# Patient Record
Sex: Female | Born: 1965 | Race: White | Hispanic: No | Marital: Single | Smoking: Former smoker
Health system: Southern US, Community
[De-identification: ages and names within clinical notes are randomized; demographics above are authoritative.]

## PROBLEM LIST (undated history)

## (undated) DIAGNOSIS — Z8619 Personal history of other infectious and parasitic diseases: Secondary | ICD-10-CM

## (undated) DIAGNOSIS — R87810 Cervical high risk human papillomavirus (HPV) DNA test positive: Secondary | ICD-10-CM

## (undated) DIAGNOSIS — K589 Irritable bowel syndrome without diarrhea: Secondary | ICD-10-CM

## (undated) DIAGNOSIS — Z9189 Other specified personal risk factors, not elsewhere classified: Secondary | ICD-10-CM

## (undated) DIAGNOSIS — Z803 Family history of malignant neoplasm of breast: Secondary | ICD-10-CM

## (undated) DIAGNOSIS — E559 Vitamin D deficiency, unspecified: Secondary | ICD-10-CM

## (undated) DIAGNOSIS — Z1371 Encounter for nonprocreative screening for genetic disease carrier status: Secondary | ICD-10-CM

## (undated) HISTORY — DX: Vitamin D deficiency, unspecified: E55.9

## (undated) HISTORY — PX: OTHER SURGICAL HISTORY: SHX169

## (undated) HISTORY — DX: Family history of malignant neoplasm of breast: Z80.3

## (undated) HISTORY — DX: Cervical high risk human papillomavirus (HPV) DNA test positive: R87.810

## (undated) HISTORY — DX: Encounter for nonprocreative screening for genetic disease carrier status: Z13.71

## (undated) HISTORY — DX: Irritable bowel syndrome, unspecified: K58.9

## (undated) HISTORY — DX: Personal history of other infectious and parasitic diseases: Z86.19

## (undated) HISTORY — DX: Other specified personal risk factors, not elsewhere classified: Z91.89

---

## 1998-05-14 ENCOUNTER — Other Ambulatory Visit: Admission: RE | Admit: 1998-05-14 | Discharge: 1998-05-14 | Payer: Self-pay | Admitting: Obstetrics and Gynecology

## 1999-12-26 ENCOUNTER — Other Ambulatory Visit: Admission: RE | Admit: 1999-12-26 | Discharge: 1999-12-26 | Payer: Self-pay | Admitting: Obstetrics and Gynecology

## 2011-04-18 ENCOUNTER — Ambulatory Visit: Payer: Self-pay | Admitting: Internal Medicine

## 2014-02-06 HISTORY — PX: COLONOSCOPY: SHX174

## 2014-05-11 ENCOUNTER — Ambulatory Visit
Admit: 2014-05-11 | Disposition: A | Discharge status: Home / Self Care | Payer: Self-pay | Attending: Gastroenterology | Admitting: Gastroenterology

## 2014-09-18 ENCOUNTER — Ambulatory Visit: Payer: Self-pay | Admitting: Family Medicine

## 2014-09-21 ENCOUNTER — Ambulatory Visit (INDEPENDENT_AMBULATORY_CARE_PROVIDER_SITE_OTHER): Payer: BLUE CROSS/BLUE SHIELD | Admitting: Family Medicine

## 2014-09-21 ENCOUNTER — Encounter: Payer: Self-pay | Admitting: Family Medicine

## 2014-09-21 VITALS — BP 110/70 | HR 68 | Ht 65.0 in | Wt 175.0 lb

## 2014-09-21 DIAGNOSIS — S46012S Strain of muscle(s) and tendon(s) of the rotator cuff of left shoulder, sequela: Secondary | ICD-10-CM

## 2014-09-21 DIAGNOSIS — K589 Irritable bowel syndrome without diarrhea: Secondary | ICD-10-CM

## 2014-09-21 DIAGNOSIS — IMO0002 Reserved for concepts with insufficient information to code with codable children: Secondary | ICD-10-CM | POA: Insufficient documentation

## 2014-09-21 DIAGNOSIS — M7502 Adhesive capsulitis of left shoulder: Secondary | ICD-10-CM

## 2014-09-21 NOTE — Progress Notes (Signed)
Name: Kelsey Hoover   MRN: [id number]    DOB: 11-12-1965   Date:09/21/2014       Progress Note  Subjective  Chief Complaint  Chief Complaint  Patient presents with  . Shoulder Pain    hurts to lift shoulder above head or put bra on- tried PT and chiropractor    Shoulder Pain  The pain is present in the left arm and left shoulder. This is a chronic problem. The current episode started more than 1 year ago. There has been a history of trauma (fellon outstreched arm). The problem occurs daily. The problem has been waxing and waning. The quality of the pain is described as aching. The pain is at a severity of 5/10. The pain is moderate. Associated symptoms include a limited range of motion, stiffness and tingling. Pertinent negatives include no fever, joint locking, joint swelling or numbness. Associated symptoms comments: Left ulna distribution. The symptoms are aggravated by activity. The treatment provided no relief.    No problem-specific assessment & plan notes found for this encounter.   History reviewed. No pertinent past medical history.  Past Surgical History  Procedure Laterality Date  . Colonoscopy  2016    cleared for 5 yrs- Dr Servando Snare    Family History  Problem Relation Age of Onset  . Cancer Mother     Social History   Social History  . Marital Status: Married    Spouse Name: N/A  . Number of Children: N/A  . Years of Education: N/A   Occupational History  . Not on file.   Social History Main Topics  . Smoking status: Former Games developer  . Smokeless tobacco: Not on file  . Alcohol Use: No  . Drug Use: No  . Sexual Activity: Yes   Other Topics Concern  . Not on file   Social History Narrative  . No narrative on file    Allergies  Allergen Reactions  . Codeine      Review of Systems  Constitutional: Negative for fever, chills, weight loss and malaise/fatigue.  HENT: Negative for ear discharge, ear pain and sore throat.   Eyes: Negative for blurred  vision.  Respiratory: Negative for cough, sputum production, shortness of breath and wheezing.   Cardiovascular: Negative for chest pain, palpitations and leg swelling.  Gastrointestinal: Negative for heartburn, nausea, abdominal pain, diarrhea, constipation, blood in stool and melena.  Genitourinary: Negative for dysuria, urgency, frequency and hematuria.  Musculoskeletal: Positive for stiffness. Negative for myalgias, back pain, joint pain and neck pain.  Skin: Negative for rash.  Neurological: Positive for tingling. Negative for dizziness, sensory change, focal weakness, numbness and headaches.  Endo/Heme/Allergies: Negative for environmental allergies and polydipsia. Does not bruise/bleed easily.  Psychiatric/Behavioral: Negative for depression and suicidal ideas. The patient is not nervous/anxious and does not have insomnia.      Objective  Filed Vitals:   09/21/14 1436  BP: 110/70  Pulse: 68  Height:  (1.651 m)  Weight: 175 lb (79.379 kg)    Physical Exam    Assessment & Plan  Problem List Items Addressed This Visit    None        Dr. Elizabeth Sauer [facility]  09/21/2014

## 2014-09-21 NOTE — Addendum Note (Signed)
Addended by: Duanne Limerick on: 09/21/2014 03:34 PM   Modules accepted: Kipp Brood

## 2014-09-21 NOTE — Progress Notes (Signed)
Name: Kelsey Hoover   MRN: [id number]    DOB: Aug 16, 1965   Date:09/21/2014       Progress Note  Subjective  Chief Complaint  Chief Complaint  Patient presents with  . Shoulder Pain    hurts to lift shoulder above head or put bra on- tried PT and chiropractor    Shoulder Pain     No problem-specific assessment & plan notes found for this encounter.   History reviewed. No pertinent past medical history.  Past Surgical History  Procedure Laterality Date  . Colonoscopy  2016    cleared for 5 yrs- Dr Servando Snare    Family History  Problem Relation Age of Onset  . Cancer Mother     Social History   Social History  . Marital Status: Married    Spouse Name: N/A  . Number of Children: N/A  . Years of Education: N/A   Occupational History  . Not on file.   Social History Main Topics  . Smoking status: Former Games developer  . Smokeless tobacco: Not on file  . Alcohol Use: No  . Drug Use: No  . Sexual Activity: Yes   Other Topics Concern  . Not on file   Social History Narrative  . No narrative on file    Allergies  Allergen Reactions  . Codeine      ROS   Objective  Filed Vitals:   09/21/14 1436  BP: 110/70  Pulse: 68  Height:  (1.651 m)  Weight: 175 lb (79.379 kg)    Physical Exam  Constitutional: She is well-developed, well-nourished, and in no distress. No distress.  HENT:  Head: Normocephalic and atraumatic.  Right Ear: External ear normal.  Left Ear: External ear normal.  Nose: Nose normal.  Mouth/Throat: Oropharynx is clear and moist.  Eyes: Conjunctivae and EOM are normal. Pupils are equal, round, and reactive to light. Right eye exhibits no discharge. Left eye exhibits no discharge.  Neck: Normal range of motion. Neck supple. No JVD present. No thyromegaly present.  Cardiovascular: Normal rate, regular rhythm, normal heart sounds and intact distal pulses.  Exam reveals no gallop and no friction rub.   No murmur heard. Pulmonary/Chest: Effort  normal and breath sounds normal.  Abdominal: Soft. Bowel sounds are normal. She exhibits no mass. There is no tenderness. There is no guarding.  Musculoskeletal: Normal range of motion. She exhibits no edema.       Left shoulder: She exhibits tenderness, pain and decreased strength.       Arms: Drop test pos/ decreased ROM esp abduction  Lymphadenopathy:    She has no cervical adenopathy.  Neurological: She is alert. She has normal reflexes.  Skin: Skin is warm and dry. She is not diaphoretic.  Psychiatric: Mood and affect normal.      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 Elizabeth Sauer [facility]  09/21/2014

## 2014-10-02 ENCOUNTER — Telehealth: Payer: Self-pay | Admitting: Gastroenterology

## 2014-10-02 NOTE — Telephone Encounter (Signed)
Patient takes Levsin prescribed by Tommy Rainwater and was recently  prescribed Voltaran by another physician. She would like to know if there are any interactions between the two. I suggested she call her pharmacist, but she would like to speak with the nurse about it. Please call and advise.

## 2014-10-02 NOTE — Telephone Encounter (Signed)
Returned phone call to patient at this time. No answer. Left voicemail for return phone call.  If phone call is returned, Neither of these medications interact with one another or any of the other medications on patient's current list.

## 2014-10-05 NOTE — Telephone Encounter (Signed)
Call made to patient once again at this time. Explained the information below. She verbalizes understanding of all information.  Encouraged to call once again with any further questions.

## 2015-02-07 DIAGNOSIS — R87810 Cervical high risk human papillomavirus (HPV) DNA test positive: Secondary | ICD-10-CM

## 2015-02-07 HISTORY — DX: Cervical high risk human papillomavirus (HPV) DNA test positive: R87.810

## 2016-01-04 ENCOUNTER — Other Ambulatory Visit: Payer: Self-pay

## 2016-08-11 ENCOUNTER — Telehealth: Payer: Self-pay

## 2016-08-11 NOTE — Telephone Encounter (Signed)
Pt has questions re herpes tx.  She has a new man who has herpes but she hasn't slept with him yet. One question she has is what is the risk of getting it if there is no outbreak.  Adv I did not have that statistic.  I offered to mail her the pamphlet but she declined.  Pt states ABC and she has discussed extensively and wants ABC to call her Mon.  [phone]

## 2016-08-14 ENCOUNTER — Encounter: Payer: Self-pay | Admitting: Obstetrics and Gynecology

## 2016-08-14 NOTE — Telephone Encounter (Signed)
Pt is calling due to a missed call please call patient.

## 2016-08-14 NOTE — Telephone Encounter (Signed)
LMTRC

## 2016-08-14 NOTE — Telephone Encounter (Signed)
Herpes etiology discussed. Suggested valtrex daily as preventive for partner. Questions answered.

## 2016-11-06 DIAGNOSIS — Z1371 Encounter for nonprocreative screening for genetic disease carrier status: Secondary | ICD-10-CM

## 2016-11-06 DIAGNOSIS — Z9189 Other specified personal risk factors, not elsewhere classified: Secondary | ICD-10-CM

## 2016-11-06 HISTORY — DX: Encounter for nonprocreative screening for genetic disease carrier status: Z13.71

## 2016-11-06 HISTORY — DX: Other specified personal risk factors, not elsewhere classified: Z91.89

## 2016-11-28 ENCOUNTER — Encounter: Payer: Self-pay | Admitting: Obstetrics and Gynecology

## 2016-11-28 ENCOUNTER — Ambulatory Visit (INDEPENDENT_AMBULATORY_CARE_PROVIDER_SITE_OTHER): Payer: BLUE CROSS/BLUE SHIELD | Admitting: Obstetrics and Gynecology

## 2016-11-28 ENCOUNTER — Telehealth: Payer: Self-pay | Admitting: Obstetrics and Gynecology

## 2016-11-28 VITALS — BP 110/60 | HR 58 | Ht 65.0 in | Wt 156.0 lb

## 2016-11-28 DIAGNOSIS — Z803 Family history of malignant neoplasm of breast: Secondary | ICD-10-CM | POA: Diagnosis not present

## 2016-11-28 DIAGNOSIS — Z124 Encounter for screening for malignant neoplasm of cervix: Secondary | ICD-10-CM

## 2016-11-28 DIAGNOSIS — N951 Menopausal and female climacteric states: Secondary | ICD-10-CM | POA: Diagnosis not present

## 2016-11-28 DIAGNOSIS — Z1151 Encounter for screening for human papillomavirus (HPV): Secondary | ICD-10-CM | POA: Diagnosis not present

## 2016-11-28 DIAGNOSIS — Z1329 Encounter for screening for other suspected endocrine disorder: Secondary | ICD-10-CM | POA: Diagnosis not present

## 2016-11-28 DIAGNOSIS — Z1321 Encounter for screening for nutritional disorder: Secondary | ICD-10-CM

## 2016-11-28 DIAGNOSIS — Z0000 Encounter for general adult medical examination without abnormal findings: Secondary | ICD-10-CM | POA: Diagnosis not present

## 2016-11-28 DIAGNOSIS — Z01419 Encounter for gynecological examination (general) (routine) without abnormal findings: Secondary | ICD-10-CM

## 2016-11-28 DIAGNOSIS — Z13220 Encounter for screening for lipoid disorders: Secondary | ICD-10-CM | POA: Diagnosis not present

## 2016-11-28 DIAGNOSIS — Z1239 Encounter for other screening for malignant neoplasm of breast: Secondary | ICD-10-CM

## 2016-11-28 DIAGNOSIS — Z1231 Encounter for screening mammogram for malignant neoplasm of breast: Secondary | ICD-10-CM

## 2016-11-28 DIAGNOSIS — Z79890 Hormone replacement therapy (postmenopausal): Secondary | ICD-10-CM

## 2016-11-28 MED ORDER — PROGESTERONE MICRONIZED 100 MG PO CAPS: 100.0000 mg | ORAL_CAPSULE | Freq: Every day | ORAL | 12 refills | Status: DC

## 2016-11-28 NOTE — Telephone Encounter (Signed)
-----   Message from Rica RecordsAlicia B Copland, PA-C sent at 11/28/2016 10:12 AM EDT ----- Regarding: appt Pls call pt to sched 6 wk MyRisk results f/u appt. Forgot to put on form before check out. Sry. Thx!!

## 2016-11-28 NOTE — Telephone Encounter (Signed)
Called and lvm for pt to call back to be schedule °

## 2016-11-28 NOTE — Progress Notes (Signed)
PCP: Patient, No Pcp Per   Chief Complaint  Patient presents with  . Gynecologic Exam    HPI:      [name] is a 51 y.o. [phone] who LMP was No LMP recorded., presents today for her annual examination.  Her menses are absent now due to menopause. She does not have intermenstrual bleeding.  She does not have vasomotor sx. She uses prometrium 100 mg 6 nights on, 1 night off. She does get sx if she drinks alcohol or eats certain foods.  Sex activity: not sexually active. She does not have vaginal dryness.  Last Pap: November 15, 2015  Results were: no abnormalities /POS HPV DNA. Repeat due today. Hx of STDs: HPV  Last mammogram: November 29, 2015  Results were: normal--routine follow-up in 12 months There is a FH of breast cancer in her mom 3 times (2 times in 1 breast, 1 time in contralateral breast). Her mat aunt had colon cancer and her MGF had pancreatic cancer. There is no FH of ovarian cancer. The patient does not do self-breast exams. Genetic testing attempted in the past but denied by [facility], even though meets guidelines. She is willing to try again because she clearly meets NCCN guidelines.  Colonoscopy: colonoscopy 3 years ago without abnormalities. . Repeat due after 5-10 years (pt unsure).   Tobacco use: The patient denies current or previous tobacco use. Alcohol use: social drinker Exercise: very active  She does get adequate calcium and Vitamin D in her diet.  She has not had any recent labs.   Past Medical History:  Diagnosis Date  . 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 Family history of breast cancer   . History of cold sores   . Vitamin D deficiency     Past Surgical History:  Procedure Laterality Date  . COLONOSCOPY  2016   cleared for 5 yrs- Dr Servando Snare  . cyrotherapy     age 54    Family History  Problem Relation Age of Onset  . Breast cancer Mother 26       age 64 and 12, bilat breasts at age 57, rt breast 70   . Pancreatic cancer Paternal Grandfather 43  . Colon cancer Maternal Aunt 49    Social History   Social History  . Marital status: Single    Spouse name: N/A  . Number of children: N/A  . Years of education: N/A   Occupational History  . Not on file.   Social History Main Topics  . Smoking status: Former Games developer  . Smokeless tobacco: Never Used  . Alcohol use No  . Drug use: No  . Sexual activity: Yes   Other Topics Concern  . Not on file   Social History Narrative  . No narrative on file    Current Meds  Medication Sig  . progesterone (PROMETRIUM) 100 MG capsule Take 1 capsule (100 mg total) by mouth daily. 6 nights on, 1 night off  . [DISCONTINUED] progesterone (PROMETRIUM) 100 MG capsule Take 100 mg by mouth daily.      ROS:  Review of Systems  Constitutional: Negative for fatigue, fever and unexpected weight change.  Respiratory: Negative for cough, shortness of breath and wheezing.   Cardiovascular: Negative for chest pain, palpitations and leg swelling.  Gastrointestinal: Positive for diarrhea. Negative for blood in stool, constipation, nausea and vomiting.  Endocrine: Negative for cold intolerance, heat intolerance and polyuria.  Genitourinary: Negative for  dyspareunia, dysuria, flank pain, frequency, genital sores, hematuria, menstrual problem, pelvic pain, urgency, vaginal bleeding, vaginal discharge and vaginal pain.  Musculoskeletal: Negative for back pain, joint swelling and myalgias.  Skin: Negative for rash.  Neurological: Negative for dizziness, syncope, light-headedness, numbness and headaches.  Hematological: Negative for adenopathy.  Psychiatric/Behavioral: Negative for agitation, confusion, sleep disturbance and suicidal ideas. The patient is not nervous/anxious.      Objective: BP 110/60   Pulse (!) 58   Ht 5\' 5"  (1.651 m)   Wt 156 lb (70.8 kg)   BMI 25.96 kg/m    Physical Exam  Constitutional: She is oriented to person, place, and  time. She appears well-developed and well-nourished.  Genitourinary: Vagina normal and uterus normal. There is no rash or tenderness on the right labia. There is no rash or tenderness on the left labia. No erythema or tenderness in the vagina. No vaginal discharge found. Right adnexum does not display mass and does not display tenderness. Left adnexum does not display mass and does not display tenderness. Cervix does not exhibit motion tenderness or polyp. Uterus is not enlarged or tender.  Neck: Normal range of motion. No thyromegaly present.  Cardiovascular: Normal rate, regular rhythm and normal heart sounds.   No murmur heard. Pulmonary/Chest: Effort normal and breath sounds normal. Right breast exhibits no mass, no nipple discharge, no skin change and no tenderness. Left breast exhibits no mass, no nipple discharge, no skin change and no tenderness.  Abdominal: Soft. There is no tenderness. There is no guarding.  Musculoskeletal: Normal range of motion.  Neurological: She is alert and oriented to person, place, and time. No cranial nerve deficit.  Psychiatric: She has a normal mood and affect. Her behavior is normal.  Vitals reviewed.   Assessment/Plan:  Encounter for annual routine gynecological examination  Cervical cancer screening - Plan: IGP, Aptima HPV  Screening for HPV (human papillomavirus) - Will call pt with results and dispo. Colpo indicated if abn. - Plan: IGP, Aptima HPV  Screening for breast cancer - Plan: MM SCREENING BREAST TOMO BILATERAL  Family history of breast cancer - MyRisk testing discussed and done today. RTO in 6 wks for results. - Plan: MM SCREENING BREAST TOMO BILATERAL, Integrated BRACAnalysis  Blood tests for routine general physical examination - Plan: Comprehensive metabolic panel, Lipid panel, TSH, VITAMIN D 25 Hydroxy (Vit-D Deficiency, Fractures)  Screening cholesterol level - Plan: Lipid panel  Encounter for vitamin deficiency screening - Plan:  VITAMIN D 25 Hydroxy (Vit-D Deficiency, Fractures)  Thyroid disorder screening - Plan: TSH  Hormone replacement therapy (HRT) - Rx RF prometrium 100 mg 6 nights on, 1 night off. F/u prn.  - Plan: progesterone (PROMETRIUM) 100 MG capsule  Menopausal vasomotor syndrome - Plan: progesterone (PROMETRIUM) 100 MG capsule   Meds ordered this encounter  Medications  . DISCONTD: progesterone (PROMETRIUM) 100 MG capsule    Sig: Take 100 mg by mouth daily.  . progesterone (PROMETRIUM) 100 MG capsule    Sig: Take 1 capsule (100 mg total) by mouth daily. 6 nights on, 1 night off    Dispense:  30 capsule    Refill:  12          GYN counsel breast self exam, mammography screening, use and side effects of HRT, menopause, adequate intake of calcium and vitamin D, diet and exercise    F/U  [facility] 6 [facility] (around [facility]09/2017[facility]  Alicia B. Copland, PA-C 11/28/2016 10:14 AM

## 2016-11-29 ENCOUNTER — Telehealth: Payer: Self-pay | Admitting: Obstetrics and Gynecology

## 2016-11-29 DIAGNOSIS — R7989 Other specified abnormal findings of blood chemistry: Secondary | ICD-10-CM

## 2016-11-29 LAB — COMPREHENSIVE METABOLIC PANEL
ALBUMIN: 4.9 g/dL (ref 3.5–5.5)
ALK PHOS: 84 IU/L (ref 39–117)
ALT: 18 IU/L (ref 0–32)
AST: 23 IU/L (ref 0–40)
Albumin/Globulin Ratio: 2 (ref 1.2–2.2)
BUN / CREAT RATIO: 18 (ref 9–23)
BUN: 17 mg/dL (ref 6–24)
Bilirubin Total: 0.6 mg/dL (ref 0.0–1.2)
CO2: 25 mmol/L (ref 20–29)
CREATININE: 0.92 mg/dL (ref 0.57–1.00)
Calcium: 9.8 mg/dL (ref 8.7–10.2)
Chloride: 100 mmol/L (ref 96–106)
GFR calc Af Amer: 82 mL/min/{1.73_m2} (ref >59)
GFR calc non Af Amer: 71 mL/min/{1.73_m2} (ref >59)
GLOBULIN, TOTAL: 2.5 g/dL (ref 1.5–4.5)
Glucose: 93 mg/dL (ref 65–99)
Potassium: 4.8 mmol/L (ref 3.5–5.2)
SODIUM: 140 mmol/L (ref 134–144)
Total Protein: 7.4 g/dL (ref 6.0–8.5)

## 2016-11-29 LAB — TSH: TSH: 5.35 u[IU]/mL — ABNORMAL HIGH (ref 0.450–4.500)

## 2016-11-29 LAB — LIPID PANEL
CHOL/HDL RATIO: 3.1 ratio (ref 0.0–4.4)
CHOLESTEROL TOTAL: 238 mg/dL — AB (ref 100–199)
HDL: 78 mg/dL (ref >39)
LDL CALC: 147 mg/dL — AB (ref 0–99)
Triglycerides: 65 mg/dL (ref 0–149)
VLDL Cholesterol Cal: 13 mg/dL (ref 5–40)

## 2016-11-29 LAB — VITAMIN D 25 HYDROXY (VIT D DEFICIENCY, FRACTURES): Vit D, 25-Hydroxy: 43.6 ng/mL (ref 30.0–100.0)

## 2016-11-29 NOTE — Telephone Encounter (Signed)
LM with lab results. Rechk TSH/free T4 at 6 wk MyRisk results f/u appt. Diet/exercise changes/wt loss for lipids. Rechk 1 yr.

## 2016-11-30 NOTE — Addendum Note (Signed)
Addended by: Althea GrimmerOPLAND, Kemari Mares B on: 11/30/2016 10:22 AM   Modules accepted: Orders

## 2016-11-30 NOTE — Telephone Encounter (Signed)
Called and lvm for pt to call back to be schedule °

## 2016-12-01 LAB — IGP, APTIMA HPV
HPV APTIMA: POSITIVE — AB
PAP SMEAR COMMENT: 0

## 2016-12-11 ENCOUNTER — Encounter: Payer: Self-pay | Admitting: Obstetrics and Gynecology

## 2016-12-11 ENCOUNTER — Telehealth: Payer: Self-pay | Admitting: Obstetrics and Gynecology

## 2016-12-11 NOTE — Telephone Encounter (Signed)
-----   Message from Rica RecordsAlicia B Copland, PA-C sent at 12/11/2016 10:14 AM EST ----- Pls call pt to sched colpo with Md. She was supposed to call back to sched but hasn't. Thx.

## 2016-12-11 NOTE — Progress Notes (Signed)
Pls call pt to sched colpo with Md. She was supposed to call back to sched but hasn't. Thx.

## 2016-12-11 NOTE — Telephone Encounter (Signed)
[facility]

## 2016-12-25 ENCOUNTER — Encounter: Payer: Self-pay | Admitting: Obstetrics and Gynecology

## 2016-12-26 ENCOUNTER — Encounter: Payer: Self-pay | Admitting: Obstetrics and Gynecology

## 2017-01-11 ENCOUNTER — Encounter: Payer: Self-pay | Admitting: Obstetrics and Gynecology

## 2017-01-11 ENCOUNTER — Ambulatory Visit (INDEPENDENT_AMBULATORY_CARE_PROVIDER_SITE_OTHER): Payer: BLUE CROSS/BLUE SHIELD | Admitting: Obstetrics and Gynecology

## 2017-01-11 VITALS — BP 100/60 | HR 60 | Ht 65.0 in | Wt 161.0 lb

## 2017-01-11 DIAGNOSIS — Z803 Family history of malignant neoplasm of breast: Secondary | ICD-10-CM

## 2017-01-11 DIAGNOSIS — Z1371 Encounter for nonprocreative screening for genetic disease carrier status: Secondary | ICD-10-CM

## 2017-01-11 DIAGNOSIS — Z9189 Other specified personal risk factors, not elsewhere classified: Secondary | ICD-10-CM | POA: Diagnosis not present

## 2017-01-11 DIAGNOSIS — R5383 Other fatigue: Secondary | ICD-10-CM | POA: Diagnosis not present

## 2017-01-11 DIAGNOSIS — R7989 Other specified abnormal findings of blood chemistry: Secondary | ICD-10-CM

## 2017-01-11 NOTE — Progress Notes (Signed)
   Chief Complaint  Patient presents with  . Results    HPI:   [name] is a 51 y.o. [phone] who LMP was No LMP recorded., presents today for  My Risk cancer genetic testing results.  Results are negative for a cancer genetic mutation.  IBIS=30 % Riskscore=36.6 % Last mammogram 12/25/16 at [facility] Vitamin D status=43.6  Patient Active Problem List   Diagnosis Date Noted  . 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 Rotator cuff (capsule) sprain and strain 09/21/2014  . Irritable bowel syndrome (IBS) 09/21/2014    Family History  Problem Relation Age of Onset  . Breast cancer Mother 33       age 21 and 34, bilat breasts at age 35, rt breast 6  . Pancreatic cancer Paternal Grandfather 42  . Colon cancer Maternal Aunt 61   Review of Systems  Constitutional: Positive for fatigue. Negative for fever.  Gastrointestinal: Negative for blood in stool, constipation, diarrhea, nausea and vomiting.  Genitourinary: Negative for dyspareunia, dysuria, flank pain, frequency, hematuria, urgency, vaginal bleeding, vaginal discharge and vaginal pain.  Musculoskeletal: Negative for back pain.  Skin: Negative for rash.   Physical Exam  Constitutional: She is oriented to person, place, and time and well-developed, well-nourished, and in no distress.  Neurological: She is alert and oriented to person, place, and time.  Psychiatric: Memory, affect and judgment normal.    Assessment/Plan: Testing for genetic disease carrier status  Family history of breast cancer  Increased risk of breast cancer  BRCA negative  Fatigue, unspecified type - Pt still has fatige sx. Due for thyroid rechk anyway. Awaking at 2:00 AM and having a hard time falling back to sleep. Most likely sleep deprivation.  - Plan: CBC with Differential/Platelet  Abnormal thyroid blood test - Plan: TSH + free T4  Result packed reviewed and explained to pt. Hard  copy given to pt.  Recommended monthly SBE, yearly CBE, yearly mammos and add Vit D3 5000 IU dialy. Also offered yearly screening breast MRI, staggered with mammos. Pt had mammo 11/18 and f/u by 3/19 if wants MRI.   Patient understands these results only apply to her and her children, and this is not indicative of genetic testing results of her other family members. It is recommended that her other family members have genetic testing done.  Pt also understands negative genetic testing doesn't mean she will never get any of these cancers.   Results handout given to pt.  81  Min spent in direct contact with pt re: counseling/coordination of care.   Alicia B. Copland, PA-C  01/11/2017 10:39 AM

## 2017-01-11 NOTE — Patient Instructions (Signed)
I value your feedback and entrusting us with your care. If you get a  patient survey, I would appreciate you taking the time to let us know about your experience today. Thank you! 

## 2017-01-12 LAB — CBC WITH DIFFERENTIAL/PLATELET
BASOS: 0 %
Basophils Absolute: 0 10*3/uL (ref 0.0–0.2)
EOS (ABSOLUTE): 0.1 10*3/uL (ref 0.0–0.4)
Eos: 2 %
Hematocrit: 39.1 % (ref 34.0–46.6)
Hemoglobin: 12.9 g/dL (ref 11.1–15.9)
IMMATURE GRANULOCYTES: 0 %
Immature Grans (Abs): 0 10*3/uL (ref 0.0–0.1)
LYMPHS ABS: 1.7 10*3/uL (ref 0.7–3.1)
Lymphs: 34 %
MCH: 30.3 pg (ref 26.6–33.0)
MCHC: 33 g/dL (ref 31.5–35.7)
MCV: 92 fL (ref 79–97)
MONOS ABS: 0.5 10*3/uL (ref 0.1–0.9)
Monocytes: 9 %
NEUTROS PCT: 55 %
Neutrophils Absolute: 2.7 10*3/uL (ref 1.4–7.0)
PLATELETS: 276 10*3/uL (ref 150–379)
RBC: 4.26 x10E6/uL (ref 3.77–5.28)
RDW: 13.6 % (ref 12.3–15.4)
WBC: 5 10*3/uL (ref 3.4–10.8)

## 2017-01-12 LAB — TSH+FREE T4
Free T4: 0.96 ng/dL (ref 0.82–1.77)
TSH: 3.43 u[IU]/mL (ref 0.450–4.500)

## 2017-01-25 ENCOUNTER — Ambulatory Visit: Payer: BLUE CROSS/BLUE SHIELD | Admitting: Obstetrics & Gynecology

## 2017-02-08 ENCOUNTER — Ambulatory Visit (INDEPENDENT_AMBULATORY_CARE_PROVIDER_SITE_OTHER): Payer: BLUE CROSS/BLUE SHIELD | Admitting: Obstetrics & Gynecology

## 2017-02-08 ENCOUNTER — Encounter: Payer: Self-pay | Admitting: Obstetrics & Gynecology

## 2017-02-08 VITALS — BP 100/60 | HR 60 | Ht 65.0 in | Wt 164.0 lb

## 2017-02-08 DIAGNOSIS — B977 Papillomavirus as the cause of diseases classified elsewhere: Secondary | ICD-10-CM

## 2017-02-08 DIAGNOSIS — N72 Inflammatory disease of cervix uteri: Secondary | ICD-10-CM | POA: Diagnosis not present

## 2017-02-08 NOTE — Progress Notes (Signed)
Referring Provider:  [facility]  HPI:  Kelsey Hoover is a 52 y.o.  [id number]  who presents today for evaluation and management of abnormal cervical cytology.    Dysplasia History:  HPV POS last 2 PAPs. New relationship >2 years ago after 20 year relationship; feels this is where she got HPV (he has h/o HPV)  ROS:  Pertinent items are noted in HPI.  OB History  Gravida Para Term Preterm AB Living  _0 SAB TAB Ectopic Multiple Live Births               # Outcome Date GA Lbr Len/2nd Weight Sex Delivery Anes PTL Lv  2 Term           1 Term              Past Medical History:  Diagnosis Date  . BRCA negative 11/2016   MyRisk neg  . 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 Family history of breast cancer   . History of cold sores   . Increased risk of breast cancer 11/2016   risksore=36.6%/IBIS=30%  . Vitamin D deficiency    Past Surgical History:  Procedure Laterality Date  . COLONOSCOPY  2016   cleared for 5 yrs- Dr Allen Norris  . cyrotherapy     age 41   SOCIAL HISTORY: Social History   Substance and Sexual Activity  Alcohol Use No  . Alcohol/week: 0.0 oz   Social History   Substance and Sexual Activity  Drug Use No   Family History  Problem Relation Age of Onset  . Breast cancer Mother 19       age 80 and 34, bilat breasts at age 52, rt breast 58  . Pancreatic cancer Paternal Grandfather 82  . Colon cancer Maternal Aunt 93   ALLERGIES:  Codeine  Current Outpatient Medications on File Prior to Visit  Medication Sig Dispense Refill  . Calcium Carbonate-Vit D-Min (CALCIUM 1200 PO) Take 1 capsule by mouth daily at 6 (six) AM.    . hyoscyamine (NULEV) 0.125 MG TBDP disintergrating tablet Place 0.125 mg under the tongue every 6 (six) hours as needed. Dr Allen Norris    . Omega-3 Fatty Acids (FISH OIL) 1000 MG CAPS Take 2 capsules by mouth daily at 6 (six) AM.    . Probiotic Product (4X PROBIOTIC PO) Take 2 tablets by mouth daily at 6 (six)  AM.    . progesterone (PROMETRIUM) 100 MG capsule Take 1 capsule (100 mg total) by mouth daily. 6 nights on, 1 night off 30 capsule 12   Physical Exam: -Vitals:  BP 100/60   Pulse 60   Ht _1  (1.651 m)   Wt 164 lb (74.4 kg)   BMI 27.29 kg/m  GEN: WD, WN, NAD.  A+ O x 3, good mood and affect. ABD:  NT, ND.  Soft, no masses.  No hernias noted.   Pelvic:   Vulva: Normal appearance.  No lesions.  Vagina: No lesions or abnormalities noted.  Support: Normal pelvic support.  Urethra No masses tenderness or scarring.  Meatus Normal size without lesions or prolapse.  Cervix: See below.  Anus: Normal exam.  No lesions.  Perineum: Normal exam.  No lesions.        Bimanual   Uterus: Normal size.  Non-tender.  Mobile.  AV.  Adnexae: No masses.  Non-tender to palpation.  Cul-de-sac: Negative for abnormality.   PROCEDURE:  1.  Urine Pregnancy Test:  not done 2.  Colposcopy performed with 4% acetic acid after verbal consent obtained                           -Aceto-white Lesions Location(s): 12 o'clock.              -Biopsy performed at 12 o'clock               -ECC indicated and performed: Yes.       -Biopsy sites made hemostatic with pressure, AgNO3, and/or Monsel's solution   -Satisfactory colposcopy: Yes.      -Evidence of Invasive cervical CA :  NO  ASSESSMENT:  Kelsey Hoover is a 52 y.o. [id number] here for  1. High risk human papilloma virus (HPV) infection of cervix    PLAN: 1.  I discussed the grading system of pap smears and HPV high risk viral types.  We will discuss and base management after colpo results return. 2. Follow up PAP 12 months, vs intervention if high grade dysplasia identified     Barnett Applebaum, MD, [facility] 02/08/2017  3:11 PM

## 2017-02-13 ENCOUNTER — Encounter: Payer: Self-pay | Admitting: Obstetrics & Gynecology

## 2017-02-13 LAB — PATHOLOGY

## 2017-02-13 NOTE — Progress Notes (Signed)
LM.  PAP 6 mos based on results.

## 2017-05-15 ENCOUNTER — Other Ambulatory Visit: Payer: Self-pay | Admitting: Chiropractic Medicine

## 2017-05-15 ENCOUNTER — Ambulatory Visit
Admission: RE | Admit: 2017-05-15 | Discharge: 2017-05-15 | Disposition: A | Discharge status: Home / Self Care | Payer: BLUE CROSS/BLUE SHIELD | Source: Ambulatory Visit | Attending: Chiropractic Medicine | Admitting: Chiropractic Medicine

## 2017-05-15 DIAGNOSIS — R52 Pain, unspecified: Secondary | ICD-10-CM

## 2017-05-15 DIAGNOSIS — R0781 Pleurodynia: Secondary | ICD-10-CM | POA: Insufficient documentation

## 2017-07-12 ENCOUNTER — Encounter: Payer: Self-pay | Admitting: Obstetrics and Gynecology

## 2017-07-12 ENCOUNTER — Ambulatory Visit: Payer: BLUE CROSS/BLUE SHIELD | Admitting: Obstetrics and Gynecology

## 2017-07-12 VITALS — BP 112/70 | HR 60 | Ht 65.0 in | Wt 154.0 lb

## 2017-07-12 DIAGNOSIS — N3000 Acute cystitis without hematuria: Secondary | ICD-10-CM | POA: Diagnosis not present

## 2017-07-12 LAB — POCT URINALYSIS DIPSTICK
Bilirubin, UA: NEGATIVE
Blood, UA: NEGATIVE
Glucose, UA: NEGATIVE
KETONES UA: NEGATIVE
NITRITE UA: NEGATIVE
PH UA: 6.5 (ref 5.0–8.0)
PROTEIN UA: NEGATIVE
Spec Grav, UA: 1.01 (ref 1.010–1.025)

## 2017-07-12 MED ORDER — NITROFURANTOIN MONOHYD MACRO 100 MG PO CAPS: 100.0000 mg | ORAL_CAPSULE | Freq: Two times a day (BID) | ORAL | 0 refills | Status: AC

## 2017-07-12 NOTE — Patient Instructions (Signed)
I value your feedback and entrusting us with your care. If you get a [facility] patient survey, I would appreciate you taking the time to let us know about your experience today. Thank you! 

## 2017-07-12 NOTE — Progress Notes (Signed)
Kelsey Patch, Kelsey Hoover   Chief Complaint  Patient presents with  . Urinary Tract Infection    Painful urination, frequency, some abdominal pain, lower back pain    HPI:      Kelsey Hoover is a 52 y.o. [id number] who LMP was No LMP recorded. (Menstrual status: Other)., presents today for UTI sx of urinary frequency, urgency with good flow, dysuria, pelvic pain for the past 3-4 days. No hematuria, LBP, fevers. No vag sx, no vag bleeding. Hx of UTIs in the distant past.    Past Medical History:  Diagnosis Date  . BRCA negative 11/2016   MyRisk neg  . 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 Family history of breast cancer   . History of cold sores   . IBS (irritable bowel syndrome)   . Increased risk of breast cancer 11/2016   risksore=36.6%/IBIS=30%  . Vitamin D deficiency     Past Surgical History:  Procedure Laterality Date  . COLONOSCOPY  2016   cleared for 5 yrs- Dr Allen Norris  . cyrotherapy     age 46    Family History  Problem Relation Age of Onset  . Breast cancer Mother 59       age 44 and 63, bilat breasts at age 55, rt breast 56  . Pancreatic cancer Paternal Grandfather 25  . Colon cancer Maternal Aunt 50    Social History   Socioeconomic History  . Marital status: Single    Spouse name: Not on file  . Number of children: Not on file  . Years of education: Not on file  . Highest education level: Not on file  Occupational History  . Not on file  Social Needs  . Financial resource strain: Not on file  . Food insecurity:    Worry: Not on file    Inability: Not on file  . Transportation needs:    Medical: Not on file    Non-medical: Not on file  Tobacco Use  . Smoking status: Former Research scientist (life sciences)  . Smokeless tobacco: Never Used  Substance and Sexual Activity  . Alcohol use: No    Alcohol/week: 0.0 oz  . Drug use: No  . Sexual activity: Not Currently    Birth control/protection: Post-menopausal  Lifestyle  . Physical  activity:    Days per week: Not on file    Minutes per session: Not on file  . Stress: Not on file  Relationships  . Social connections:    Talks on phone: Not on file    Gets together: Not on file    Attends religious service: Not on file    Active member of club or organization: Not on file    Attends meetings of clubs or organizations: Not on file    Relationship status: Not on file  . Intimate partner violence:    Fear of current or ex partner: Not on file    Emotionally abused: Not on file    Physically abused: Not on file    Forced sexual activity: Not on file  Other Topics Concern  . Not on file  Social History Narrative  . Not on file    Outpatient Medications Prior to Visit  Medication Sig Dispense Refill  . Calcium Carbonate-Vit D-Min (CALCIUM 1200 PO) Take 1 capsule by mouth daily at 6 (six) AM.    . Omega-3 Fatty Acids (FISH OIL) 1000 MG CAPS Take 2 capsules by mouth daily at 6 (  six) AM.    . Probiotic Product (4X PROBIOTIC PO) Take 2 tablets by mouth daily at 6 (six) AM.    . progesterone (PROMETRIUM) 100 MG capsule Take 1 capsule (100 mg total) by mouth daily. 6 nights on, 1 night off 30 capsule 12  . hyoscyamine (NULEV) 0.125 MG TBDP disintergrating tablet Place 0.125 mg under the tongue every 6 (six) hours as needed. Dr Allen Norris     No facility-administered medications prior to visit.     ROS:  Review of Systems  Constitutional: Negative for fever.  Gastrointestinal: Negative for blood in stool, constipation, diarrhea, nausea and vomiting.  Genitourinary: Positive for dysuria and frequency. Negative for dyspareunia, flank pain, hematuria, urgency, vaginal bleeding, vaginal discharge and vaginal pain.  Musculoskeletal: Negative for back pain.  Skin: Negative for rash.   BREAST: No symptoms   OBJECTIVE:   Vitals:  BP 112/70   Pulse 60   Ht '5\' 5"'$  (1.651 m)   Wt 154 lb (69.9 kg)   BMI 25.63 kg/m   Physical Exam  Constitutional: She is oriented to  person, place, and time. She appears well-developed.  Neck: Normal range of motion.  Pulmonary/Chest: Effort normal.  Abdominal: There is no CVA tenderness.  Neurological: She is alert and oriented to person, place, and time. No cranial nerve deficit.  Psychiatric: She has a normal mood and affect. Her behavior is normal. Judgment and thought content normal.  Vitals reviewed.   Results: Results for orders placed or performed in visit on 07/12/17 (from the past 24 hour(s))  POCT Urinalysis Dipstick     Status: Abnormal   Collection Time: 07/12/17 11:48 AM  Result Value Ref Range   Color, UA yellow    Clarity, UA clear    Glucose, UA Negative Negative   Bilirubin, UA neg    Ketones, UA neg    Spec Grav, UA 1.010 1.010 - 1.025   Blood, UA neg    pH, UA 6.5 5.0 - 8.0   Protein, UA Negative Negative   Urobilinogen, UA  0.2 or 1.0 E.U./dL   Nitrite, UA neg    Leukocytes, UA Moderate (2+) (A) Negative   Appearance     Odor       Assessment/Plan: Acute cystitis without hematuria - Rx macrobid. Check C&S. F/u prn.  - Plan: Urine Culture, POCT Urinalysis Dipstick, nitrofurantoin, macrocrystal-monohydrate, (MACROBID) 100 MG capsule    Meds ordered this encounter  Medications  . nitrofurantoin, macrocrystal-monohydrate, (MACROBID) 100 MG capsule    Sig: Take 1 capsule (100 mg total) by mouth 2 (two) times daily for 5 days.    Dispense:  10 capsule    Refill:  0    Order Specific Question:   Supervising Provider    Answer:   Gae Dry [[id number]]      Return if symptoms worsen or fail to improve.  Kelsey B. Copland, PA-C 07/12/2017 11:49 AM

## 2017-07-14 LAB — URINE CULTURE

## 2017-07-17 ENCOUNTER — Other Ambulatory Visit: Payer: Self-pay | Admitting: Obstetrics and Gynecology

## 2017-07-17 ENCOUNTER — Telehealth: Payer: Self-pay

## 2017-07-17 MED ORDER — FLUCONAZOLE 150 MG PO TABS: 150.0000 mg | ORAL_TABLET | Freq: Once | ORAL | 0 refills | Status: AC

## 2017-07-17 MED ORDER — CIPROFLOXACIN HCL 500 MG PO TABS: 500.0000 mg | ORAL_TABLET | Freq: Two times a day (BID) | ORAL | 0 refills | Status: AC

## 2017-07-17 NOTE — Telephone Encounter (Signed)
Rx cipro and diflucan eRxd. C&S shows susceptibility to macrobid. F/u if sx still persist after cipro tx.

## 2017-07-17 NOTE — Progress Notes (Signed)
Still having UTI sx after macrobid tx. Rx diflucan prn yeast

## 2017-07-17 NOTE — Telephone Encounter (Signed)
Pt reports she is getting ready to take her last ABX pill this pm for her UTI. She is still having symptoms. They did improve but seem to be coming back. States she needs something for yeast infection as well since being on ABX. Cb#[phone]

## 2017-07-18 NOTE — Telephone Encounter (Signed)
Left message for pt to be aware to pick up rx

## 2017-07-31 ENCOUNTER — Other Ambulatory Visit: Payer: Self-pay | Admitting: Obstetrics and Gynecology

## 2017-07-31 ENCOUNTER — Telehealth: Payer: Self-pay

## 2017-07-31 DIAGNOSIS — B001 Herpesviral vesicular dermatitis: Secondary | ICD-10-CM

## 2017-07-31 MED ORDER — VALACYCLOVIR HCL 1 G PO TABS: ORAL_TABLET | ORAL | 0 refills | Status: DC

## 2017-07-31 NOTE — Telephone Encounter (Signed)
Rx valtrex eRxd. Dose is 2,000 mg BID for 1 day with sx. Not sure what her 500 mg is for. Rn to notify pt.

## 2017-07-31 NOTE — Telephone Encounter (Signed)
Pt is calling for a new rx for generic valtrex 500mg .  The doctor that previously rx'd it has retired.  Currently has fever blister.  Pharm is [facility].  [phone]

## 2017-07-31 NOTE — Telephone Encounter (Signed)
Pt aware via VM

## 2017-07-31 NOTE — Telephone Encounter (Signed)
Please advise 

## 2017-07-31 NOTE — Progress Notes (Signed)
Rx valtrex for cold sores.  

## 2017-08-22 ENCOUNTER — Emergency Department
Admission: EM | Admit: 2017-08-22 | Discharge: 2017-08-22 | Disposition: A | Discharge status: Home / Self Care | Payer: BLUE CROSS/BLUE SHIELD | Attending: Emergency Medicine | Admitting: Emergency Medicine

## 2017-08-22 ENCOUNTER — Other Ambulatory Visit: Payer: Self-pay

## 2017-08-22 ENCOUNTER — Encounter: Payer: Self-pay | Admitting: Emergency Medicine

## 2017-08-22 DIAGNOSIS — Z79899 Other long term (current) drug therapy: Secondary | ICD-10-CM | POA: Diagnosis not present

## 2017-08-22 DIAGNOSIS — T7840XA Allergy, unspecified, initial encounter: Secondary | ICD-10-CM | POA: Diagnosis present

## 2017-08-22 DIAGNOSIS — Z87891 Personal history of nicotine dependence: Secondary | ICD-10-CM | POA: Diagnosis not present

## 2017-08-22 MED ORDER — EPINEPHRINE 0.3 MG/0.3ML IJ SOAJ: 0.3000 mg | Freq: Once | INTRAMUSCULAR | 1 refills | Status: AC

## 2017-08-22 MED ORDER — FAMOTIDINE 20 MG PO TABS
20.0000 mg | ORAL_TABLET | Freq: Once | ORAL | Status: AC
  Administered 2017-08-22: 20 mg via ORAL
  Filled 2017-08-22: qty 1

## 2017-08-22 MED ORDER — METHYLPREDNISOLONE SODIUM SUCC 125 MG IJ SOLR
125.0000 mg | Freq: Once | INTRAMUSCULAR | Status: AC
Start: 2017-08-22 — End: 2017-08-22
  Administered 2017-08-22: 125 mg via INTRAVENOUS
  Filled 2017-08-22: qty 2

## 2017-08-22 MED ORDER — PREDNISONE 50 MG PO TABS: ORAL_TABLET | ORAL | 0 refills | Status: DC

## 2017-08-22 NOTE — ED Provider Notes (Signed)
[facility] Emergency Department Provider Note  ____________________________________________  Time seen: Approximately 10:15 PM  I have reviewed the triage vital signs and the nursing notes.   HISTORY  Chief Complaint Insect Bite    HPI Kelsey Hoover is a 52 y.o. female presents to the emergency department with diffuse urticaria of the upper extremities, abdomen, back, groin, lower extremities and face after being bitten by a horse fly while walking her dog approximately a hour and a half ago.  Patient reports that hives have progressively worsened.  She denies shortness of breath, dysphasia, chest pain, chest tightness, nausea, vomiting, abdominal pain or syncope.  No prior history of anaphylaxis.  Patient took 50 mg of Benadryl and 20 mg of famotidine prior to presenting to the emergency department.   Past Medical History:  Diagnosis Date  . BRCA negative 11/2016   MyRisk neg  . 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 Family history of breast cancer   . History of cold sores   . IBS (irritable bowel syndrome)   . Increased risk of breast cancer 11/2016   risksore=36.6%/IBIS=30%  . Vitamin D deficiency     Patient Active Problem List   Diagnosis Date Noted  . 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 Rotator cuff (capsule) sprain and strain 09/21/2014  . Irritable bowel syndrome (IBS) 09/21/2014    Past Surgical History:  Procedure Laterality Date  . COLONOSCOPY  2016   cleared for 5 yrs- Dr Allen Norris  . cyrotherapy     age 62    Prior to Admission medications   Medication Sig Start Date End Date Taking? Authorizing Provider  Calcium Carbonate-Vit D-Min (CALCIUM 1200 PO) Take 1 capsule by mouth daily at 6 (six) AM.    [provider]  EPINEPHrine 0.3 mg/0.3 mL IJ SOAJ injection Inject 0.3 mLs (0.3 mg total) into the muscle once for 1 dose. 08/22/17  08/22/17  Lannie Fields, PA-C  hyoscyamine (NULEV) 0.125 MG TBDP disintergrating tablet Place 0.125 mg under the tongue every 6 (six) hours as needed. Dr Allen Norris    [provider]  Omega-3 Fatty Acids (FISH OIL) 1000 MG CAPS Take 2 capsules by mouth daily at 6 (six) AM.    [provider]  predniSONE (DELTASONE) 50 MG tablet Take one 50 mg tablet once daily for the next four days. 08/22/17   Lannie Fields, PA-C  Probiotic Product (4X PROBIOTIC PO) Take 2 tablets by mouth daily at 6 (six) AM.    [provider]  progesterone (PROMETRIUM) 100 MG capsule Take 1 capsule (100 mg total) by mouth daily. 6 nights on, 1 night off 16/10/96   Copland, Alicia B, PA-C  valACYclovir (VALTREX) 1000 MG tablet Take 2 tabs BID for 1 day prn sx 0/45/40   Copland, Alicia B, PA-C    Allergies Codeine  Family History  Problem Relation Age of Onset  . Breast cancer Mother 56       age 8 and 81, bilat breasts at age 66, rt breast 58  . Pancreatic cancer Paternal Grandfather 56  . Colon cancer Maternal Aunt 17    Social History Social History   Tobacco Use  . Smoking status: Former Research scientist (life sciences)  . Smokeless tobacco: Never Used  Substance Use Topics  . Alcohol use: No    Alcohol/week: 0.0 oz  . Drug use: No     Review of Systems  Constitutional: No fever/chills Eyes: No visual changes. No discharge ENT: No upper respiratory complaints. Cardiovascular: no chest pain. Respiratory: no cough. No SOB. Gastrointestinal: No abdominal pain.  No nausea, no vomiting.  No diarrhea.  No constipation. Genitourinary: Negative for dysuria. No hematuria Musculoskeletal: Negative for musculoskeletal pain. Skin: Patient has urticaria. Neurological: Negative for headaches, focal weakness or numbness.   ____________________________________________   PHYSICAL EXAM:  VITAL SIGNS: ED Triage Vitals  Enc Vitals Group     BP 08/22/17 2156 140/70     Pulse Rate 08/22/17 2156 66     Resp  08/22/17 2156 16     Temp 08/22/17 2156 98.2 F (36.8 C)     Temp Source 08/22/17 2156 Oral     SpO2 08/22/17 2156 98 %     Weight 08/22/17 2153 154 lb (69.9 kg)     Height 08/22/17 2153 '5\' 5"'$  (1.651 m)     Head Circumference --      Peak Flow --      Pain Score 08/22/17 2153 0     Pain Loc --      Pain Edu? --      Excl. in [place]? --      Constitutional: Alert and oriented. Well appearing and in no acute distress. Eyes: Conjunctivae are normal. PERRL. EOMI. Head: Atraumatic. ENT:      Ears: TMs are pearly.      Nose: No congestion/rhinnorhea.      Mouth/Throat: Mucous membranes are moist.  Hematological/Lymphatic/Immunilogical: No cervical lymphadenopathy. Cardiovascular: Normal rate, regular rhythm. Normal S1 and S2.  Good peripheral circulation. Respiratory: Normal respiratory effort without tachypnea or retractions. Lungs CTAB. Good air entry to the bases with no decreased or absent breath sounds. Gastrointestinal: Bowel sounds 4 quadrants. Soft and nontender to palpation. No guarding or rigidity. No palpable masses. No distention. No CVA tenderness. Musculoskeletal: Full range of motion to all extremities. No gross deformities appreciated. Neurologic:  Normal speech and language. No gross focal neurologic deficits are appreciated.  Skin: Patient has diffuse urticaria of the abdomen, back, upper extremities, lower extremities and face. Psychiatric: Mood and affect are normal. Speech and behavior are normal. Patient exhibits appropriate insight and judgement.   ____________________________________________   LABS (all labs ordered are listed, but only abnormal results are displayed)  Labs Reviewed - No data to display ____________________________________________  EKG   ____________________________________________  RADIOLOGY   No results found.  ____________________________________________    PROCEDURES  Procedure(s) performed:     Procedures    Medications  methylPREDNISolone sodium succinate (SOLU-MEDROL) 125 mg/2 mL injection 125 mg (125 mg Intravenous Given 08/22/17 2217)  famotidine (PEPCID) tablet 20 mg (20 mg Oral Given 08/22/17 2219)     ____________________________________________   INITIAL IMPRESSION / ASSESSMENT AND PLAN / ED COURSE  Pertinent labs & imaging results that were available during my care of the patient were reviewed by me and considered in my medical decision making (see chart for details).  Review of the [place] CSRS was performed in accordance of the [place] prior to dispensing any controlled drugs.      Assessment and plan Urticaria Patient presents to the emergency department with diffuse urticaria after being bitten by a horse fly earlier this evening.  Patient was given IV Solu-Medrol in the emergency department as well as 20 mg of famotidine.  Hives almost completely resolved in the emergency department after IV Solu-Medrol.  Patient was discharged with prednisone and advised to take 25 mg of Benadryl every 4  hours until hives resolve.  Patient was advised to take famotidine 40 mg daily until hives resolve.  Patient was also discharged with a prescription for an EpiPen.  Vital signs are reassuring prior to discharge.  All patient questions were answered.     ____________________________________________  FINAL CLINICAL IMPRESSION(S) / ED DIAGNOSES  Final diagnoses:  Allergic reaction, initial encounter      NEW MEDICATIONS STARTED DURING THIS VISIT:  ED Discharge Orders        Ordered    EPINEPHrine 0.3 mg/0.3 mL IJ SOAJ injection   Once     08/22/17 2327    predniSONE (DELTASONE) 50 MG tablet     08/22/17 2327          This chart was dictated using voice recognition software/Dragon. Despite best efforts to proofread, errors can occur which can change the meaning. Any change was purely unintentional.    Lannie Fields, PA-C 08/22/17 2333    Orbie Pyo, MD 08/22/17 [phone]

## 2017-08-22 NOTE — ED Notes (Signed)
Pt reports was walking her dog and got stung by horseflies, on the back on her legs, upon arrival to exam room pt presents with hives to legs, arms,  Torso,  back and mild rash to jaw, denies any shortness of breath, denies any difficulty swallowing, pt talks in complete sentences no respiratory distress noted

## 2017-08-22 NOTE — ED Triage Notes (Signed)
Patient ambulatory to triage with steady gait, without difficulty or distress noted; pt reports that she was stung several times by horsefly; c/o generalized itching; took 50mg  benadryl PTA

## 2017-12-31 ENCOUNTER — Other Ambulatory Visit: Payer: Self-pay | Admitting: Obstetrics and Gynecology

## 2017-12-31 DIAGNOSIS — Z79890 Hormone replacement therapy (postmenopausal): Secondary | ICD-10-CM

## 2017-12-31 DIAGNOSIS — N951 Menopausal and female climacteric states: Secondary | ICD-10-CM

## 2017-12-31 NOTE — Telephone Encounter (Signed)
Please advise 

## 2018-01-07 ENCOUNTER — Encounter: Payer: Self-pay | Admitting: Obstetrics and Gynecology

## 2018-01-07 ENCOUNTER — Other Ambulatory Visit (HOSPITAL_COMMUNITY)
Admission: RE | Admit: 2018-01-07 | Discharge: 2018-01-07 | Disposition: A | Discharge status: Home / Self Care | Payer: BLUE CROSS/BLUE SHIELD | Source: Ambulatory Visit | Attending: Obstetrics and Gynecology | Admitting: Obstetrics and Gynecology

## 2018-01-07 ENCOUNTER — Ambulatory Visit (INDEPENDENT_AMBULATORY_CARE_PROVIDER_SITE_OTHER): Payer: BLUE CROSS/BLUE SHIELD | Admitting: Obstetrics and Gynecology

## 2018-01-07 VITALS — BP 118/70 | HR 58 | Ht 65.0 in | Wt 161.0 lb

## 2018-01-07 DIAGNOSIS — N951 Menopausal and female climacteric states: Secondary | ICD-10-CM

## 2018-01-07 DIAGNOSIS — Z124 Encounter for screening for malignant neoplasm of cervix: Secondary | ICD-10-CM | POA: Diagnosis not present

## 2018-01-07 DIAGNOSIS — B977 Papillomavirus as the cause of diseases classified elsewhere: Secondary | ICD-10-CM

## 2018-01-07 DIAGNOSIS — Z9189 Other specified personal risk factors, not elsewhere classified: Secondary | ICD-10-CM

## 2018-01-07 DIAGNOSIS — Z79890 Hormone replacement therapy (postmenopausal): Secondary | ICD-10-CM

## 2018-01-07 DIAGNOSIS — Z1239 Encounter for other screening for malignant neoplasm of breast: Secondary | ICD-10-CM

## 2018-01-07 DIAGNOSIS — Z1231 Encounter for screening mammogram for malignant neoplasm of breast: Secondary | ICD-10-CM

## 2018-01-07 DIAGNOSIS — Z01419 Encounter for gynecological examination (general) (routine) without abnormal findings: Secondary | ICD-10-CM | POA: Diagnosis not present

## 2018-01-07 DIAGNOSIS — Z1151 Encounter for screening for human papillomavirus (HPV): Secondary | ICD-10-CM | POA: Insufficient documentation

## 2018-01-07 DIAGNOSIS — N72 Inflammatory disease of cervix uteri: Secondary | ICD-10-CM

## 2018-01-07 DIAGNOSIS — Z803 Family history of malignant neoplasm of breast: Secondary | ICD-10-CM

## 2018-01-07 MED ORDER — PROGESTERONE MICRONIZED 100 MG PO CAPS: ORAL_CAPSULE | ORAL | 3 refills | Status: AC

## 2018-01-07 NOTE — Progress Notes (Signed)
PCP: Juline Patch, MD   Chief Complaint  Patient presents with  . Gynecologic Exam    HPI:      [name] is a 52 y.o. [phone] who LMP was No LMP recorded. (Menstrual status: Other)., presents today for her annual examination.  Her menses are absent now due to menopause. She does not have intermenstrual bleeding.  She does have increasing but tolerable vasomotor sx. She uses prometrium 100 mg 6 nights on, 1 night off.   Sex activity: not sexually active. She does not have vaginal dryness.  Last Pap: 11/28/16 Results were: no abnormalities /POS HPV DNA. Same as 10/17. 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 [place]on Kingfisher. Repeat due today. Hx of STDs: HPV  Last mammogram: 12/25/16  Results were: normal--routine follow-up in 12 months There is a FH of breast cancer in her mom 3 times (2 times in 1 breast, 1 time in contralateral breast). Her mat aunt had colon cancer and her MGF had pancreatic cancer. There is no FH of ovarian cancer. The patient does not do self-breast exams. Pt is MyRisk neg 2018, IBIS=30%/riskscore=36.6%. Pt due for mammo, interested in screen breast MRI due to having met her deductible already this yr. She is taking Vit D supp.  Colonoscopy: colonoscopy 3 years ago with Dr. Allen Norris without abnormalities.  Repeat due after 5 years.   Tobacco use: The patient denies current or previous tobacco use. Alcohol use: social drinker Exercise: very active  She does get adequate calcium and Vitamin D in her diet.  Had elevated lipids 2018. Just had repeat labs with PCP.    Past Medical History:  Diagnosis Date  . BRCA negative 11/2016   MyRisk neg  . 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 Family history of breast cancer   . History of cold sores   . IBS (irritable bowel syndrome)   . Increased risk of breast cancer 11/2016   risksore=36.6%/IBIS=30%  . Vitamin D deficiency     Past Surgical History:  Procedure Laterality  Date  . COLONOSCOPY  2016   cleared for 5 yrs- Dr Allen Norris  . cyrotherapy     age 48    Family History  Problem Relation Age of Onset  . Breast cancer Mother 50       age 9 and 73, bilat breasts at age 29, rt breast 77  . Pancreatic cancer Paternal Grandfather 30  . Colon cancer Maternal Aunt 12  . Pancreatic cancer Maternal Grandfather 59    Social History   Socioeconomic History  . Marital status: Single    Spouse name: Not on file  . Number of children: Not on file  . Years of education: Not on file  . Highest education level: Not on file  Occupational History  . Not on file  Social Needs  . Financial resource strain: Not on file  . Food insecurity:    Worry: Not on file    Inability: Not on file  . Transportation needs:    Medical: Not on file    Non-medical: Not on file  Tobacco Use  . Smoking status: Former Research scientist (life sciences)  . Smokeless tobacco: Never Used  Substance and Sexual Activity  . Alcohol use: Yes    Alcohol/week: 0.0 standard drinks    Comment: occ  . Drug use: No  . Sexual activity: Not Currently    Birth control/protection: Post-menopausal  Lifestyle  . Physical activity:  Days per week: Not on file    Minutes per session: Not on file  . Stress: Not on file  Relationships  . Social connections:    Talks on phone: Not on file    Gets together: Not on file    Attends religious service: Not on file    Active member of club or organization: Not on file    Attends meetings of clubs or organizations: Not on file    Relationship status: Not on file  . Intimate partner violence:    Fear of current or ex partner: Not on file    Emotionally abused: Not on file    Physically abused: Not on file    Forced sexual activity: Not on file  Other Topics Concern  . Not on file  Social History Narrative  . Not on file    Current Meds  Medication Sig  . Calcium Carbonate-Vit D-Min (CALCIUM 1200 PO) Take 1 capsule by mouth daily at 6 (six) AM.  . Omega-3 Fatty  Acids (FISH OIL) 1000 MG CAPS Take 2 capsules by mouth daily at 6 (six) AM.  . Probiotic Product (4X PROBIOTIC PO) Take 2 tablets by mouth daily at 6 (six) AM.  . progesterone (PROMETRIUM) 100 MG capsule TAKE 1 TO 2 CAPSULES BY MOUTH DAILY - 6 NIGHTS ON 1 NIGHT OFF  . [DISCONTINUED] progesterone (PROMETRIUM) 100 MG capsule TAKE ONE CAPSULE BY MOUTH DAILY - 6 NIGHTS ON 1 NIGHT OFF      ROS:  Review of Systems  Constitutional: Positive for fatigue. Negative for fever and unexpected weight change.  Respiratory: Negative for cough, shortness of breath and wheezing.   Cardiovascular: Negative for chest pain, palpitations and leg swelling.  Gastrointestinal: Negative for blood in stool, constipation, diarrhea, nausea and vomiting.  Endocrine: Negative for cold intolerance, heat intolerance and polyuria.  Genitourinary: Negative for dyspareunia, dysuria, flank pain, frequency, genital sores, hematuria, menstrual problem, pelvic pain, urgency, vaginal bleeding, vaginal discharge and vaginal pain.  Musculoskeletal: Negative for back pain, joint swelling and myalgias.  Skin: Negative for rash.  Neurological: Negative for dizziness, syncope, light-headedness, numbness and headaches.  Hematological: Negative for adenopathy.  Psychiatric/Behavioral: Negative for agitation, confusion, sleep disturbance and suicidal ideas. The patient is not nervous/anxious.      Objective: BP 118/70   Pulse (!) 58   Ht _0  (1.651 m)   Wt 161 lb (73 kg)   BMI 26.79 kg/m    Physical Exam  Constitutional: She is oriented to person, place, and time. She appears well-developed and well-nourished.  Genitourinary: Vagina normal and uterus normal. There is no rash or tenderness on the right labia. There is no rash or tenderness on the left labia. No erythema or tenderness in the vagina. No vaginal discharge found. Right adnexum does not display mass and does not display tenderness. Left adnexum does not display mass  and does not display tenderness. Cervix does not exhibit motion tenderness or polyp. Uterus is not enlarged or tender.  Neck: Normal range of motion. No thyromegaly present.  Cardiovascular: Normal rate, regular rhythm and normal heart sounds.  No murmur heard. Pulmonary/Chest: Effort normal and breath sounds normal. Right breast exhibits no mass, no nipple discharge, no skin change and no tenderness. Left breast exhibits no mass, no nipple discharge, no skin change and no tenderness.  Abdominal: Soft. There is no tenderness. There is no guarding.  Musculoskeletal: Normal range of motion.  Neurological: She is alert and oriented to person, place, and  time. No cranial nerve deficit.  Psychiatric: She has a normal mood and affect. Her behavior is normal.  Vitals reviewed.   Assessment/Plan:  Encounter for annual routine gynecological examination  Cervical cancer screening - Plan: Cytology - PAP  Screening for HPV (human papillomavirus) - Plan: Cytology - PAP  High risk human papilloma virus (HPV) infection of cervix - Repeat pap today. Will call with results.   Screening for breast cancer - Pt to sched 3D mammo at [facility].  - Plan: MM 3D SCREEN BREAST BILATERAL, MR BREAST BILATERAL W [place] CAD  Family history of breast cancer - Plan: MM 3D SCREEN BREAST BILATERAL, MR BREAST BILATERAL W [place] CAD  Increased risk of breast cancer - IBIS=30%/riskscore=36%. Pt aware of monthy SBE, Q6 mo CBE, yearly mammo and scr breast MRI. Pt to have MRI this yr after mammo. Cont Vit D - Plan: MR BREAST BILATERAL W WO CONTRAST INC CAD  Menopausal vasomotor syndrome - Increase prometrium for increased sx. Alternate 100 mg with 200 mg QOHS for 150 mg average dose. F/u via phone in 1-2 months prn - Plan: progesterone (PROMETRIUM) 100 MG capsule  Hormone replacement therapy (HRT) - Plan: progesterone (PROMETRIUM) 100 MG capsule   Meds ordered this encounter  Medications  . progesterone  (PROMETRIUM) 100 MG capsule    Sig: TAKE 1 TO 2 CAPSULES BY MOUTH DAILY - 6 NIGHTS ON 1 NIGHT OFF    Dispense:  135 capsule    Refill:  3    Order Specific Question:   Supervising Provider    Answer:   Gae Dry [[id number]]          GYN counsel breast self exam, mammography screening, use and side effects of HRT, menopause, adequate intake of calcium and vitamin D, diet and exercise    F/U  Return in about 1 year (around 01/08/2019)./in 6 months for CBE and possible repeat pap  Timoty Bourke B. Tascha Casares, PA-C 01/07/2018 9:10 AM

## 2018-01-07 NOTE — Patient Instructions (Signed)
I value your feedback and entrusting us with your care. If you get a [facility] patient survey, I would appreciate you taking the time to let us know about your experience today. Thank you!  [facility]: [phone]    

## 2018-01-09 LAB — CYTOLOGY - PAP
Diagnosis: NEGATIVE
HPV: DETECTED — AB

## 2018-01-14 ENCOUNTER — Telehealth: Payer: Self-pay | Admitting: Obstetrics and Gynecology

## 2018-01-14 NOTE — Telephone Encounter (Signed)
Patient is returning missed call. Please advise 

## 2018-01-14 NOTE — Telephone Encounter (Signed)
Pt with abn pap again and needs repeat colposcopy. LMTRC

## 2018-01-14 NOTE — Telephone Encounter (Signed)
LMTRC

## 2018-01-15 ENCOUNTER — Ambulatory Visit
Admission: RE | Admit: 2018-01-15 | Discharge: 2018-01-15 | Disposition: A | Discharge status: Home / Self Care | Payer: BLUE CROSS/BLUE SHIELD | Source: Ambulatory Visit | Attending: Obstetrics and Gynecology | Admitting: Obstetrics and Gynecology

## 2018-01-15 DIAGNOSIS — Z803 Family history of malignant neoplasm of breast: Secondary | ICD-10-CM | POA: Insufficient documentation

## 2018-01-15 DIAGNOSIS — Z1239 Encounter for other screening for malignant neoplasm of breast: Secondary | ICD-10-CM | POA: Diagnosis present

## 2018-01-15 NOTE — Telephone Encounter (Signed)
Done

## 2018-01-20 NOTE — Progress Notes (Signed)
Pls see if MRI can be sched before end of year. Thx

## 2018-01-21 ENCOUNTER — Encounter: Payer: Self-pay | Admitting: Obstetrics and Gynecology

## 2018-01-21 NOTE — Addendum Note (Signed)
Addended by: Althea GrimmerOPLAND, Nicola Heinemann B on: 01/21/2018 02:06 PM   Modules accepted: Orders

## 2018-01-31 ENCOUNTER — Ambulatory Visit (INDEPENDENT_AMBULATORY_CARE_PROVIDER_SITE_OTHER): Payer: BLUE CROSS/BLUE SHIELD | Admitting: Obstetrics & Gynecology

## 2018-01-31 ENCOUNTER — Encounter: Payer: Self-pay | Admitting: Obstetrics & Gynecology

## 2018-01-31 ENCOUNTER — Other Ambulatory Visit (HOSPITAL_COMMUNITY)
Admission: RE | Admit: 2018-01-31 | Discharge: 2018-01-31 | Disposition: A | Discharge status: Home / Self Care | Payer: BLUE CROSS/BLUE SHIELD | Source: Ambulatory Visit | Attending: Obstetrics & Gynecology | Admitting: Obstetrics & Gynecology

## 2018-01-31 VITALS — BP 100/60 | Ht 65.0 in | Wt 162.0 lb

## 2018-01-31 DIAGNOSIS — B977 Papillomavirus as the cause of diseases classified elsewhere: Secondary | ICD-10-CM

## 2018-01-31 DIAGNOSIS — Z87410 Personal history of cervical dysplasia: Secondary | ICD-10-CM | POA: Insufficient documentation

## 2018-01-31 DIAGNOSIS — N870 Mild cervical dysplasia: Secondary | ICD-10-CM

## 2018-01-31 NOTE — Progress Notes (Signed)
Referring Provider:  [facility]  HPI:  Kelsey Hoover is a 52 y.o.  [id number]  who presents today for evaluation and management of abnormal cervical cytology.    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  OB History  Gravida Para Term Preterm AB Living  '2 2 2     2  '$ SAB TAB Ectopic Multiple Live Births          2    # Outcome Date GA Lbr Len/2nd Weight Sex Delivery Anes PTL Lv  2 Term           1 Term             Past Medical History:  Diagnosis Date  . BRCA negative 11/2016   MyRisk neg  . 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 Family history of breast cancer   . History of cold sores   . IBS (irritable bowel syndrome)   . Increased risk of breast cancer 11/2016   risksore=36.6%/IBIS=30%  . Vitamin D deficiency     Past Surgical History:  Procedure Laterality Date  . COLONOSCOPY  2016   cleared for 5 yrs- Dr Allen Norris  . cyrotherapy     age 45    SOCIAL HISTORY:  Social History   Substance and Sexual Activity  Alcohol Use Yes  . Alcohol/week: 0.0 standard drinks   Comment: occ    Social History   Substance and Sexual Activity  Drug Use No     Family History  Problem Relation Age of Onset  . Breast cancer Mother 56       age 67 and 11, bilat breasts at age 30, rt breast 48  . Pancreatic cancer Paternal Grandfather 12  . Colon cancer Maternal Aunt 34  . Pancreatic cancer Maternal Grandfather 50    ALLERGIES:  Codeine  Current Outpatient Medications on File Prior to Visit  Medication Sig Dispense Refill  . Calcium Carbonate-Vit D-Min (CALCIUM 1200 PO) Take 1 capsule by mouth daily at 6 (six) AM.    . Omega-3 Fatty Acids (FISH OIL) 1000 MG CAPS Take 2 capsules by mouth daily at 6 (six) AM.    . Probiotic Product (4X PROBIOTIC PO) Take 2 tablets by mouth daily at 6 (six) AM.    . progesterone (PROMETRIUM) 100 MG capsule TAKE 1 TO 2 CAPSULES BY MOUTH DAILY - 6 NIGHTS ON 1 NIGHT  OFF 135 capsule 3  . valACYclovir (VALTREX) 1000 MG tablet Take 2 tabs BID for 1 day prn sx (Patient not taking: Reported on 01/07/2018) 30 tablet 0   No current facility-administered medications on file prior to visit.     Physical Exam: -Vitals:  BP 100/60   Ht '5\' 5"'$  (1.651 m)   Wt 162 lb (73.5 kg)   BMI 26.96 kg/m  GEN: WD, WN, NAD.  A+ O x 3, good mood and affect. ABD:  NT, ND.  Soft, no masses.  No hernias noted.   Pelvic:   Vulva: Normal appearance.  No lesions.  Vagina: No lesions or abnormalities noted.  Support: Normal pelvic support.  Urethra No masses tenderness or scarring.  Meatus Normal size without lesions or prolapse.  Cervix: See below.  Anus: Normal exam.  No lesions.  Perineum: Normal exam.  No lesions.        Bimanual   Uterus: Normal size.  Non-tender.  Mobile.  AV.  Adnexae: No masses.  Non-tender to palpation.  Cul-de-sac: Negative for abnormality.   PROCEDURE: 1.  Urine Pregnancy Test:  not done 2.  Colposcopy performed with 4% acetic acid after verbal consent obtained                                         -Aceto-white Lesions Location(s): none.              -Biopsy performed at 6, 12 o'clock               -ECC indicated and performed: Yes.       -Biopsy sites made hemostatic with pressure, AgNO3, and/or Monsel's solution   -Satisfactory colposcopy: Yes.      -Evidence of Invasive cervical CA :  NO  ASSESSMENT:  Kelsey Hoover is a 52 y.o. [id number] here for  1. HPV (human papilloma virus) infection   2. History of cervical dysplasia    PLAN: 1.  I discussed the grading system of pap smears and HPV high risk viral types.  We will discuss and base management after colpo results return. 2. Follow up PAP 6 months, vs intervention if high grade dysplasia identified 3. Treatment of persistantly abnormal PAP smears and cervical dysplasia, even mild, is discussed w pt today in detail, as well as the pros and cons of Cryo and LEEP procedures. Will  consider and discuss after results.  Barnett Applebaum, MD, [facility], [facility] 01/31/2018  2:22 PM

## 2018-01-31 NOTE — Patient Instructions (Signed)
Colposcopy, Care After  This sheet gives you information about how to care for yourself after your procedure. Your health care provider may also give you more specific instructions. If you have problems or questions, contact your health care provider.  What can I expect after the procedure?  If you had a colposcopy without a biopsy, you can expect to feel fine right away, but you may have some spotting for a few days. You can go back to your normal activities.  If you had a colposcopy with a biopsy, it is common to have:   Soreness and pain. This may last for a few days.   Light-headedness.   Mild vaginal bleeding or dark-colored, grainy discharge. This may last for a few days. The discharge may be due to a solution that was used during the procedure. You may need to wear a sanitary pad during this time.   Spotting for at least 48 hours after the procedure.  Follow these instructions at home:     Take over-the-counter and prescription medicines only as told by your health care provider. Talk with your health care provider about what type of over-the-counter pain medicine and prescription medicine you can start taking again. It is especially important to talk with your health care provider if you take blood-thinning medicine.   Do not drive or use heavy machinery while taking prescription pain medicine.   For at least 3 days after your procedure, or as long as told by your health care provider, avoid:  ? Douching.  ? Using tampons.  ? Having sexual intercourse.   Continue to use birth control (contraception).   Limit your physical activity for the first day after the procedure as told by your health care provider. Ask your health care provider what activities are safe for you.   It is up to you to get the results of your procedure. Ask your health care provider, or the department performing the procedure, when your results will be ready.   Keep all follow-up visits as told by your health care provider.  This is important.  Contact a health care provider if:   You develop a skin rash.  Get help right away if:   You are bleeding heavily from your vagina or you are passing blood clots. This includes using more than one sanitary pad per hour for 2 hours in a row.   You have a fever or chills.   You have pelvic pain.   You have abnormal, yellow-colored, or bad-smelling vaginal discharge. This could be a sign of infection.   You have severe pain or cramps in your lower abdomen that do not get better with medicine.   You feel light-headed or dizzy, or you faint.  Summary   If you had a colposcopy without a biopsy, you can expect to feel fine right away, but you may have some spotting for a few days. You can go back to your normal activities.   If you had a colposcopy with a biopsy, you may notice mild pain and spotting for 48 hours after the procedure.   Avoid douching, using tampons, and having sexual intercourse for 3 days after the procedure or as long as told by your health care provider.   Contact your health care provider if you have bleeding, severe pain, or signs of infection.  This information is not intended to replace advice given to you by your health care provider. Make sure you discuss any questions you have with your   health care provider.  Document Released: 11/13/2012 Document Revised: 09/10/2015 Document Reviewed: 09/10/2015  Elsevier Interactive Patient Education  2019 [employer]

## 2018-02-02 ENCOUNTER — Ambulatory Visit
Admission: RE | Admit: 2018-02-02 | Discharge: 2018-02-02 | Disposition: A | Discharge status: Home / Self Care | Payer: BLUE CROSS/BLUE SHIELD | Source: Ambulatory Visit | Attending: Obstetrics and Gynecology | Admitting: Obstetrics and Gynecology

## 2018-02-02 DIAGNOSIS — Z9189 Other specified personal risk factors, not elsewhere classified: Secondary | ICD-10-CM

## 2018-02-02 DIAGNOSIS — Z1239 Encounter for other screening for malignant neoplasm of breast: Secondary | ICD-10-CM

## 2018-02-02 DIAGNOSIS — Z803 Family history of malignant neoplasm of breast: Secondary | ICD-10-CM

## 2018-02-02 MED ORDER — GADOBUTROL 1 MMOL/ML IV SOLN
8.0000 mL | Freq: Once | INTRAVENOUS | Status: AC | PRN
  Administered 2018-02-02: 8 mL via INTRAVENOUS

## 2018-02-04 ENCOUNTER — Telehealth: Payer: Self-pay | Admitting: Obstetrics & Gynecology

## 2018-02-04 NOTE — Telephone Encounter (Signed)
Patient is calling for labs results. Please advise. 

## 2018-02-08 ENCOUNTER — Ambulatory Visit: Payer: BLUE CROSS/BLUE SHIELD

## 2018-03-08 ENCOUNTER — Other Ambulatory Visit: Payer: Self-pay | Admitting: Obstetrics and Gynecology

## 2018-03-08 DIAGNOSIS — B001 Herpesviral vesicular dermatitis: Secondary | ICD-10-CM

## 2018-12-20 ENCOUNTER — Telehealth: Payer: Self-pay

## 2018-12-20 NOTE — Telephone Encounter (Signed)
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 Asked Dorothyann Peng and we advised checking with [facility] since she has heard they do rapid test. Pt will call around.

## 2018-12-23 NOTE — Telephone Encounter (Signed)
Patient states she went to [facility] on Saturday and is still waiting for her covid results. She is having sore throat, extreme fatigue and muscle aches. She feels exactly like she did 3 years ago when she had Mono. She is inquiring if she can get a Mono and Stret test. She's trying to minimize her time out of work as much as possible. Cb#[phone]

## 2018-12-23 NOTE — Telephone Encounter (Signed)
Spoke w/patient. Advised per [facility], we do not do Mono or Strep testing. She would need to contact her PCP.

## 2019-01-12 NOTE — Progress Notes (Deleted)
PCP: Juline Patch, MD   No chief complaint on file.   HPI:      [name] is a 53 y.o. [phone] who LMP was No LMP recorded. (Menstrual status: Other)., presents today for her annual examination.  Her menses are absent now due to menopause. She does not have intermenstrual bleeding.  She does have increasing but tolerable vasomotor sx. She uses prometrium 100 mg 6 nights on, 1 night off.   Sex activity: not sexually active. She does not have vaginal dryness.  Last Pap: 01/07/18 Results were: no abnormalities /POS HPV DNA. Same as 10/17 and 2018. 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 Kenton Kingfisher. Recommended colpo last yr with abn pap but pt declined and wanted repeat pap in 6 months. Not done. Repeat due today. Hx of STDs: HPV  Last mammogram: 01/15/18  Results were: normal--routine follow-up in 12 months. Screening breast MRI 02/01/18 was normal.  There is a FH of breast cancer in her mom 3 times (2 times in 1 breast, 1 time in contralateral breast). Her mat aunt had colon cancer and her MGF had pancreatic cancer. There is no FH of ovarian cancer. The patient does not do self-breast exams. Pt is MyRisk neg 2018, IBIS=30%/riskscore=36.6%. Pt due for mammo, interested in screen breast MRI due to having met her deductible already this yr. She is taking Vit D supp.  Colonoscopy: colonoscopy 4 years ago with Dr. Allen Norris without abnormalities.  Repeat due after 5 years.   Tobacco use: The patient denies current or previous tobacco use. Alcohol use: social drinker Exercise: very active  She does get adequate calcium and Vitamin D in her diet.  Had elevated lipids 2018. Just had repeat labs with PCP.    Past Medical History:  Diagnosis Date  . BRCA negative 11/2016   MyRisk neg  . 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 Family history of breast cancer   . History of cold sores   . IBS (irritable bowel syndrome)   . Increased risk of breast  cancer 11/2016   risksore=36.6%/IBIS=30%  . Vitamin D deficiency     Past Surgical History:  Procedure Laterality Date  . COLONOSCOPY  2016   cleared for 5 yrs- Dr Allen Norris  . cyrotherapy     age 7    Family History  Problem Relation Age of Onset  . Breast cancer Mother 24       age 75 and 39, bilat breasts at age 89, rt breast 50  . Pancreatic cancer Paternal Grandfather 63  . Colon cancer Maternal Aunt 106  . Pancreatic cancer Maternal Grandfather 3    Social History   Socioeconomic History  . Marital status: Single    Spouse name: Not on file  . Number of children: Not on file  . Years of education: Not on file  . Highest education level: Not on file  Occupational History  . Not on file  Social Needs  . Financial resource strain: Not on file  . Food insecurity    Worry: Not on file    Inability: Not on file  . Transportation needs    Medical: Not on file    Non-medical: Not on file  Tobacco Use  . Smoking status: Former Research scientist (life sciences)  . Smokeless tobacco: Never Used  Substance and Sexual Activity  . Alcohol use: Yes    Alcohol/week: 0.0 standard drinks    Comment: occ  .  Drug use: No  . Sexual activity: Not Currently    Birth control/protection: Post-menopausal  Lifestyle  . Physical activity    Days per week: Not on file    Minutes per session: Not on file  . Stress: Not on file  Relationships  . Social Herbalist on phone: Not on file    Gets together: Not on file    Attends religious service: Not on file    Active member of club or organization: Not on file    Attends meetings of clubs or organizations: Not on file    Relationship status: Not on file  . Intimate partner violence    Fear of current or ex partner: Not on file    Emotionally abused: Not on file    Physically abused: Not on file    Forced sexual activity: Not on file  Other Topics Concern  . Not on file  Social History Narrative  . Not on file    No outpatient medications  have been marked as taking for the 01/13/19 encounter (Appointment) with Copland, Deirdre Evener, PA-C.      ROS:  Review of Systems  Constitutional: Positive for fatigue. Negative for fever and unexpected weight change.  Respiratory: Negative for cough, shortness of breath and wheezing.   Cardiovascular: Negative for chest pain, palpitations and leg swelling.  Gastrointestinal: Negative for blood in stool, constipation, diarrhea, nausea and vomiting.  Endocrine: Negative for cold intolerance, heat intolerance and polyuria.  Genitourinary: Negative for dyspareunia, dysuria, flank pain, frequency, genital sores, hematuria, menstrual problem, pelvic pain, urgency, vaginal bleeding, vaginal discharge and vaginal pain.  Musculoskeletal: Negative for back pain, joint swelling and myalgias.  Skin: Negative for rash.  Neurological: Negative for dizziness, syncope, light-headedness, numbness and headaches.  Hematological: Negative for adenopathy.  Psychiatric/Behavioral: Negative for agitation, confusion, sleep disturbance and suicidal ideas. The patient is not nervous/anxious.      Objective: There were no vitals taken for this visit.   Physical Exam Constitutional:      Appearance: She is well-developed.  Genitourinary:     Vagina and uterus normal.     No vaginal discharge, erythema or tenderness.     No cervical motion tenderness or polyp.     Uterus is not enlarged or tender.     No right or left adnexal mass present.     Right adnexa not tender.     Left adnexa not tender.  Neck:     Musculoskeletal: Normal range of motion.     Thyroid: No thyromegaly.  Cardiovascular:     Rate and Rhythm: Normal rate and regular rhythm.     Heart sounds: Normal heart sounds. No murmur.  Pulmonary:     Effort: Pulmonary effort is normal.     Breath sounds: Normal breath sounds.  Chest:     Breasts:        Right: No mass, nipple discharge, skin change or tenderness.        Left: No mass, nipple  discharge, skin change or tenderness.  Abdominal:     Palpations: Abdomen is soft.     Tenderness: There is no abdominal tenderness. There is no guarding.  Musculoskeletal: Normal range of motion.  Neurological:     Mental Status: She is alert and oriented to person, place, and time.     Cranial Nerves: No cranial nerve deficit.  Psychiatric:        Behavior: Behavior normal.  Vitals signs reviewed.  Assessment/Plan:  No diagnosis found.   No orders of the defined types were placed in this encounter.         GYN counsel breast self exam, mammography screening, use and side effects of HRT, menopause, adequate intake of calcium and vitamin D, diet and exercise    F/U  No follow-ups on file./in 6 months for CBE and possible repeat pap  Alicia B. Copland, PA-C 01/12/2019 6:41 PM

## 2019-01-13 ENCOUNTER — Other Ambulatory Visit: Payer: Self-pay

## 2019-01-13 ENCOUNTER — Ambulatory Visit: Payer: BLUE CROSS/BLUE SHIELD | Admitting: Obstetrics and Gynecology

## 2019-05-08 IMAGING — MR MR BILATERAL BREAST WITHOUT AND WITH CONTRAST
8 of 12 series · 33 of 48 positions shown · IV contrast (8ml gadavist)
Comparison: None.

CLINICAL DATA: 52-year-old female presenting for high risk
screening MRI due to family history and has an elevated lifetime
risk of 36%.

LABS:  None.
EXAM:
BILATERAL BREAST MRI WITH AND WITHOUT CONTRAST
TECHNIQUE: Multiplanar, multisequence MR images of both breasts were obtained
prior to and following the intravenous administration of 8 ml of
Gadavist

[Series 2: t2_tirm_tra ipat (a-p) · axial · 3.0mm · 0.70mm/px · 1 of 55 slices shown]
[im 1/55]
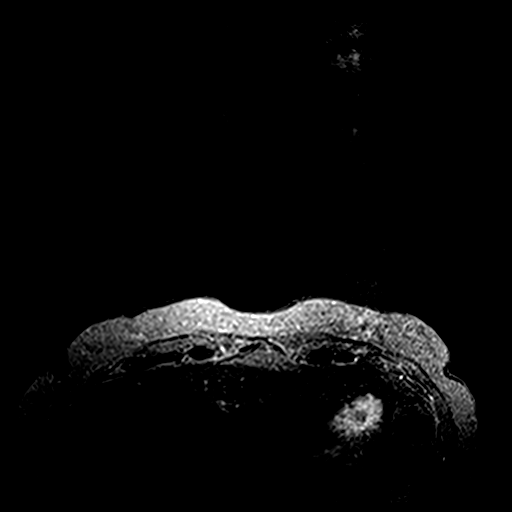

[Series 3: fl3d pre-cm no · axial · non-contrast · 1.2mm · 0.94mm/px · z∈[-86,+85]mm · 5 of 144 slices shown]
[im 1/144]
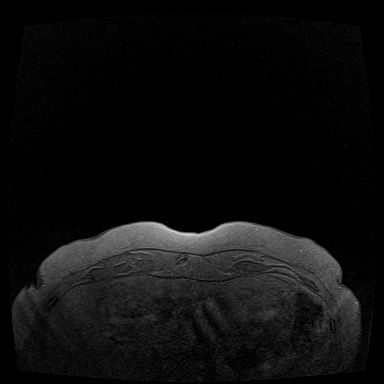
[im 36/144]
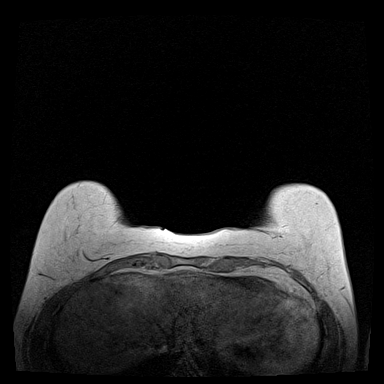
[im 72/144]
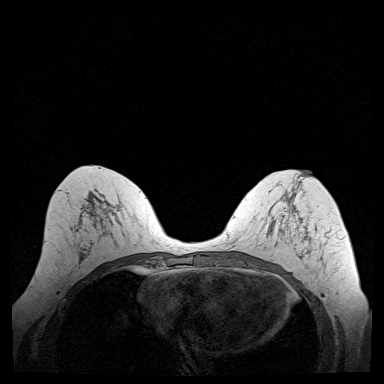
[im 108/144]
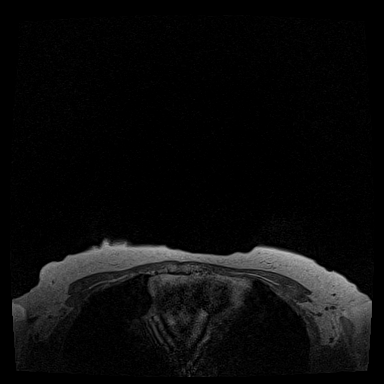
[im 144/144]
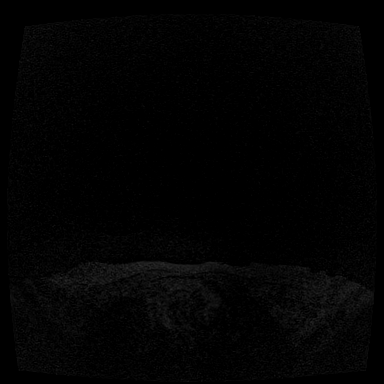

[Series 4: fl3d pre-cm · axial · non-contrast · 1.2mm · 0.94mm/px · z∈[-86,+85]mm · 5 of 144 slices shown]
[im 1/144]
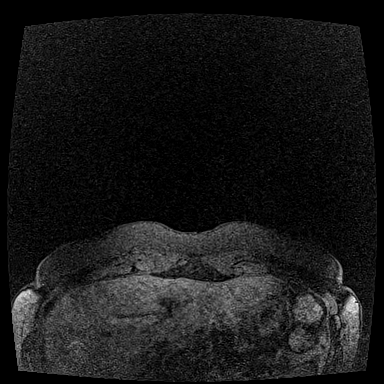
[im 36/144]
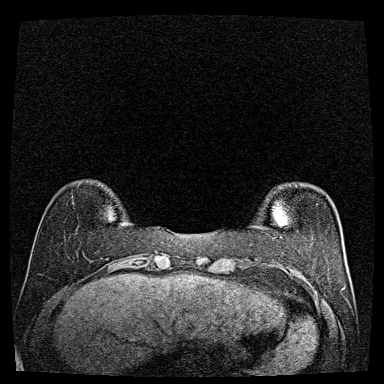
[im 72/144]
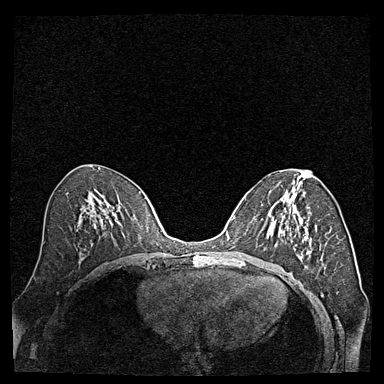
[im 108/144]
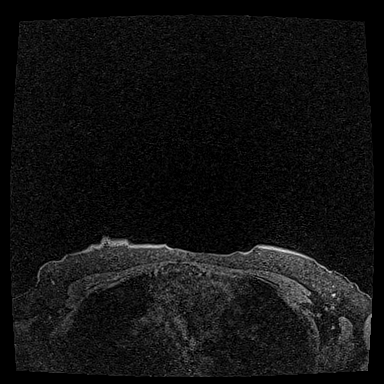
[im 144/144]
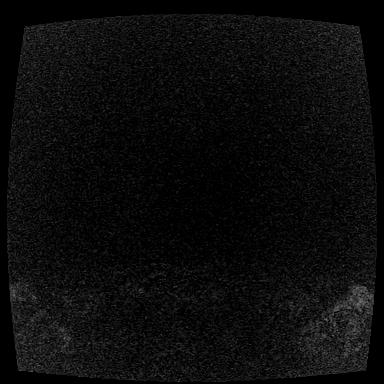

[Series 5: fl3d post-cm 20 · axial · 1.2mm · 0.94mm/px · z∈[-86,+85]mm · 5 of 144 slices shown (1 of 3)]
[im 1/144]
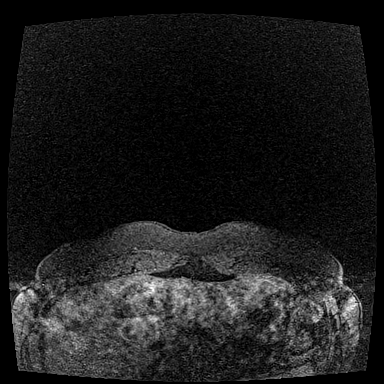
[im 36/144]
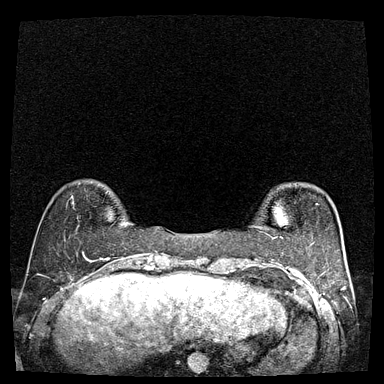
[im 72/144]
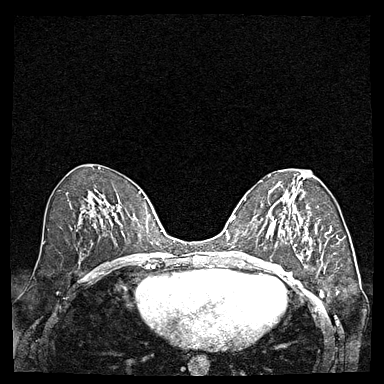
[im 108/144]
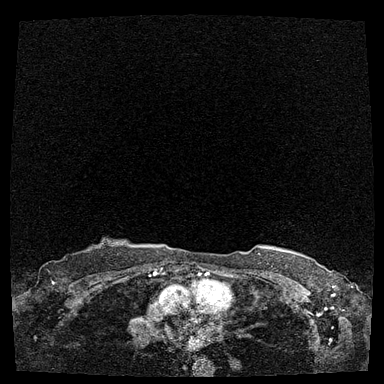
[im 144/144]
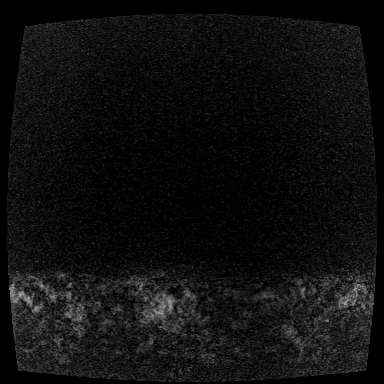

[Series 6: fl3d post-cm 20 · axial · 1.2mm · 0.94mm/px · z∈[-86,+85]mm · 5 of 144 slices shown (2 of 3)]
[im 1/144]
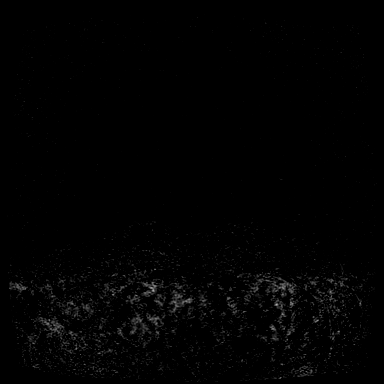
[im 36/144]
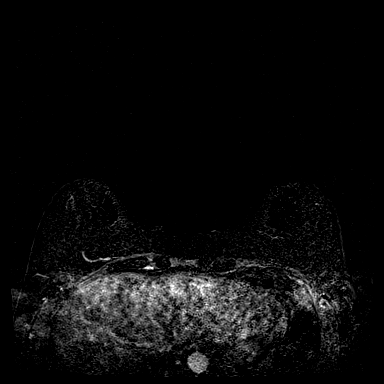
[im 72/144]
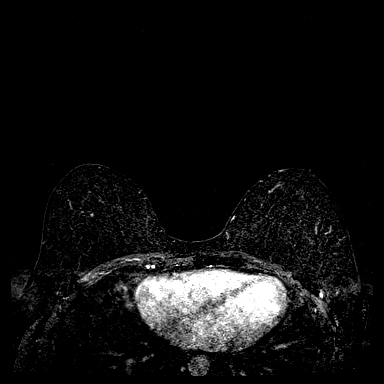
[im 108/144]
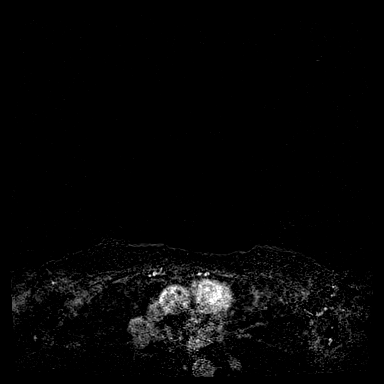
[im 144/144]
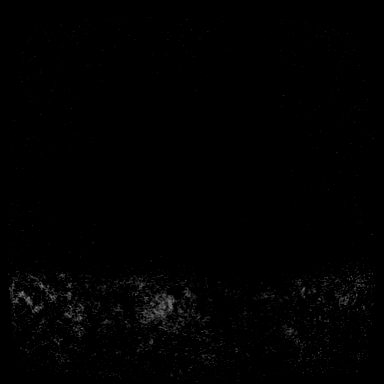

[Series 7: fl3d post-cm 20 · axial · 172.8mm · 0.94mm/px · 1 of 1 slices shown (3 of 3)]
[im 1/1]
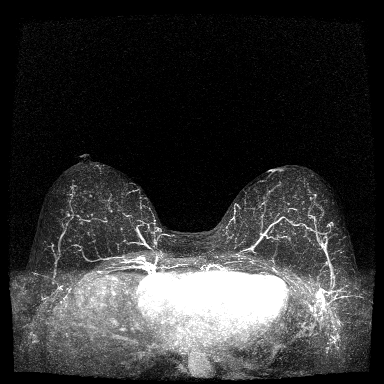

[Series 8: fl3d post-cm 3min · axial · 1.2mm · 0.94mm/px · z∈[-86,+85]mm · 6 of 144 slices shown]
[im 1/144]
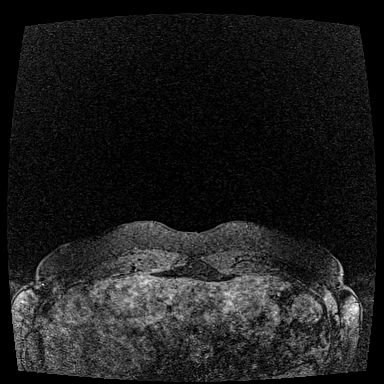
[im 29/144]
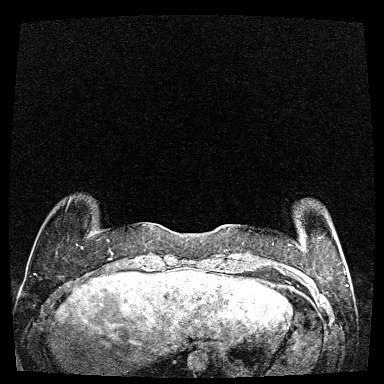
[im 58/144]
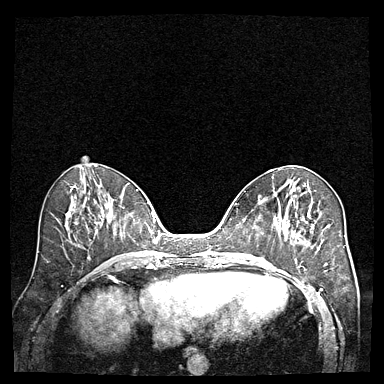
[im 86/144]
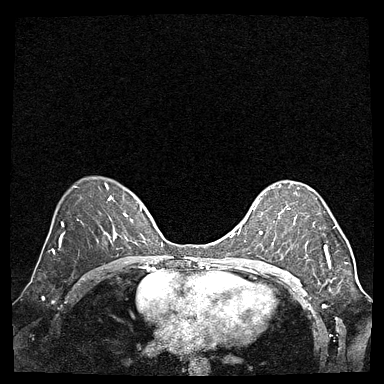
[im 115/144]
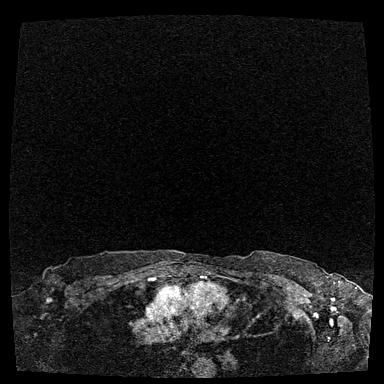
[im 144/144]
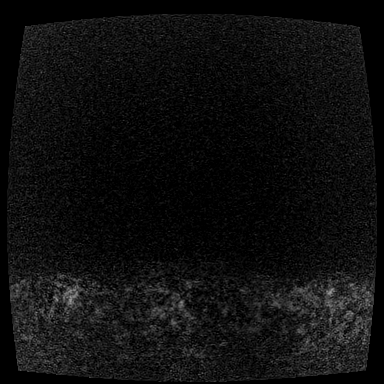

[Series 9: fl3d post-cm 3min_sub · axial · 1.2mm · 0.94mm/px · z∈[-86,+49]mm · 5 of 143 slices shown]
[im 1/143]
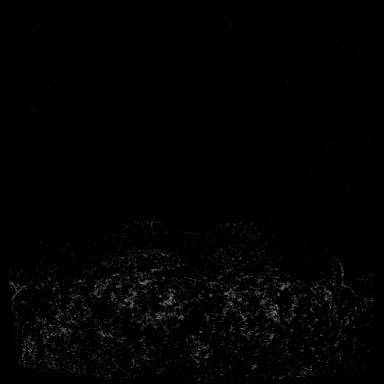
[im 29/143]
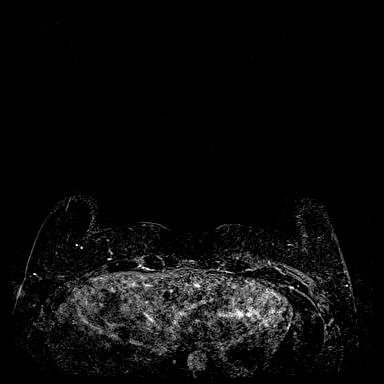
[im 57/143]
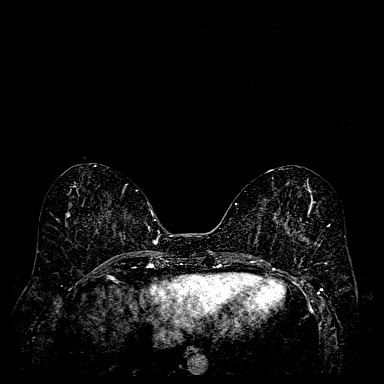
[im 86/143]
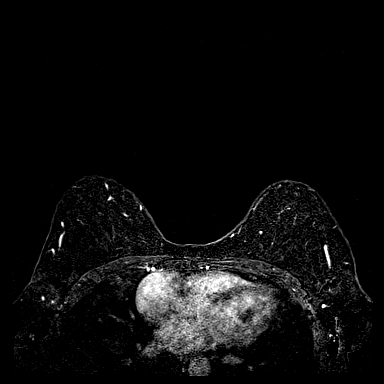
[im 114/143]
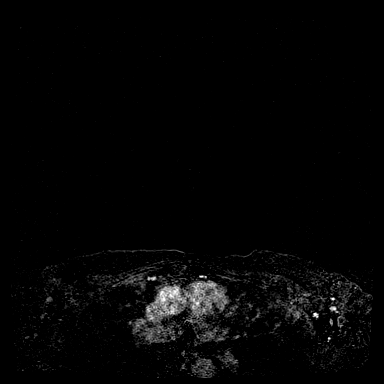

[33 of 48 positions shown; findings below may reference images not displayed]

Three-dimensional MR images were rendered by post-processing of the
original MR data on an independent workstation. The
three-dimensional MR images were interpreted, and findings are
reported in the following complete MRI report for this study. Three
dimensional images were evaluated at the independent [employer]
workstation
FINDINGS: Breast composition: c. Heterogeneous fibroglandular tissue.

Background parenchymal enhancement: Mild.

Right breast: No mass or abnormal enhancement.

Left breast: No mass or abnormal enhancement.

Lymph nodes: No abnormal appearing lymph nodes.

Ancillary findings:  None.
IMPRESSION: No MRI evidence of malignancy in the bilateral breasts.

RECOMMENDATION:
1.  Screening mammogram in one year.(Code:DG-[id number])

2.  Annual high risk screening MRI is recommended in 1 year.

BI-RADS CATEGORY  1: Negative.

## 2021-03-23 ENCOUNTER — Other Ambulatory Visit: Payer: Self-pay | Admitting: Physician Assistant

## 2021-03-23 DIAGNOSIS — Z780 Asymptomatic menopausal state: Secondary | ICD-10-CM | POA: Diagnosis not present

## 2021-03-23 DIAGNOSIS — Z7184 Encounter for health counseling related to travel: Secondary | ICD-10-CM | POA: Diagnosis not present

## 2021-03-23 DIAGNOSIS — Z1231 Encounter for screening mammogram for malignant neoplasm of breast: Secondary | ICD-10-CM

## 2021-05-03 ENCOUNTER — Ambulatory Visit
Admission: RE | Admit: 2021-05-03 | Discharge: 2021-05-03 | Disposition: A | Discharge status: Home / Self Care | Payer: 59 | Source: Ambulatory Visit | Attending: Physician Assistant | Admitting: Physician Assistant

## 2021-05-03 ENCOUNTER — Other Ambulatory Visit: Payer: Self-pay

## 2021-05-03 DIAGNOSIS — Z1231 Encounter for screening mammogram for malignant neoplasm of breast: Secondary | ICD-10-CM | POA: Diagnosis not present

## 2021-05-04 ENCOUNTER — Inpatient Hospital Stay
Admission: RE | Admit: 2021-05-04 | Discharge: 2021-05-04 | Disposition: A | Discharge status: Home / Self Care | Payer: Self-pay | Source: Ambulatory Visit | Attending: *Deleted | Admitting: *Deleted

## 2021-05-04 ENCOUNTER — Other Ambulatory Visit: Payer: Self-pay | Admitting: *Deleted

## 2021-05-04 DIAGNOSIS — Z1231 Encounter for screening mammogram for malignant neoplasm of breast: Secondary | ICD-10-CM

## 2021-05-05 DIAGNOSIS — R635 Abnormal weight gain: Secondary | ICD-10-CM | POA: Diagnosis not present

## 2021-05-05 DIAGNOSIS — R5383 Other fatigue: Secondary | ICD-10-CM | POA: Diagnosis not present

## 2021-05-05 DIAGNOSIS — R69 Illness, unspecified: Secondary | ICD-10-CM | POA: Diagnosis not present

## 2021-05-05 DIAGNOSIS — E559 Vitamin D deficiency, unspecified: Secondary | ICD-10-CM | POA: Diagnosis not present

## 2021-05-05 DIAGNOSIS — E289 Ovarian dysfunction, unspecified: Secondary | ICD-10-CM | POA: Diagnosis not present

## 2021-05-05 DIAGNOSIS — D519 Vitamin B12 deficiency anemia, unspecified: Secondary | ICD-10-CM | POA: Diagnosis not present

## 2021-05-05 DIAGNOSIS — E7800 Pure hypercholesterolemia, unspecified: Secondary | ICD-10-CM | POA: Diagnosis not present

## 2021-08-22 DIAGNOSIS — E289 Ovarian dysfunction, unspecified: Secondary | ICD-10-CM | POA: Diagnosis not present

## 2021-08-22 DIAGNOSIS — R3589 Other polyuria: Secondary | ICD-10-CM | POA: Diagnosis not present

## 2021-08-22 DIAGNOSIS — E7800 Pure hypercholesterolemia, unspecified: Secondary | ICD-10-CM | POA: Diagnosis not present

## 2021-08-22 DIAGNOSIS — R5383 Other fatigue: Secondary | ICD-10-CM | POA: Diagnosis not present

## 2021-08-22 DIAGNOSIS — R635 Abnormal weight gain: Secondary | ICD-10-CM | POA: Diagnosis not present

## 2021-08-22 DIAGNOSIS — F5109 Other insomnia not due to a substance or known physiological condition: Secondary | ICD-10-CM | POA: Diagnosis not present

## 2021-08-22 DIAGNOSIS — E559 Vitamin D deficiency, unspecified: Secondary | ICD-10-CM | POA: Diagnosis not present

## 2021-09-17 DIAGNOSIS — J029 Acute pharyngitis, unspecified: Secondary | ICD-10-CM | POA: Diagnosis not present

## 2021-12-07 DIAGNOSIS — N390 Urinary tract infection, site not specified: Secondary | ICD-10-CM | POA: Diagnosis not present

## 2021-12-15 DIAGNOSIS — N390 Urinary tract infection, site not specified: Secondary | ICD-10-CM | POA: Diagnosis not present

## 2022-02-03 DIAGNOSIS — E289 Ovarian dysfunction, unspecified: Secondary | ICD-10-CM | POA: Diagnosis not present

## 2022-02-03 DIAGNOSIS — E559 Vitamin D deficiency, unspecified: Secondary | ICD-10-CM | POA: Diagnosis not present

## 2022-02-03 DIAGNOSIS — R635 Abnormal weight gain: Secondary | ICD-10-CM | POA: Diagnosis not present

## 2022-02-03 DIAGNOSIS — F5109 Other insomnia not due to a substance or known physiological condition: Secondary | ICD-10-CM | POA: Diagnosis not present

## 2022-02-03 DIAGNOSIS — R5383 Other fatigue: Secondary | ICD-10-CM | POA: Diagnosis not present

## 2022-02-03 DIAGNOSIS — D519 Vitamin B12 deficiency anemia, unspecified: Secondary | ICD-10-CM | POA: Diagnosis not present

## 2022-02-03 DIAGNOSIS — E7800 Pure hypercholesterolemia, unspecified: Secondary | ICD-10-CM | POA: Diagnosis not present

## 2022-02-03 DIAGNOSIS — R3589 Other polyuria: Secondary | ICD-10-CM | POA: Diagnosis not present

## 2022-03-15 DIAGNOSIS — J019 Acute sinusitis, unspecified: Secondary | ICD-10-CM | POA: Diagnosis not present

## 2022-03-15 DIAGNOSIS — H6692 Otitis media, unspecified, left ear: Secondary | ICD-10-CM | POA: Diagnosis not present

## 2022-03-27 DIAGNOSIS — J3489 Other specified disorders of nose and nasal sinuses: Secondary | ICD-10-CM | POA: Diagnosis not present

## 2022-03-27 DIAGNOSIS — H6992 Unspecified Eustachian tube disorder, left ear: Secondary | ICD-10-CM | POA: Diagnosis not present

## 2022-07-18 DIAGNOSIS — J0100 Acute maxillary sinusitis, unspecified: Secondary | ICD-10-CM | POA: Diagnosis not present

## 2022-07-28 DIAGNOSIS — H02886 Meibomian gland dysfunction of left eye, unspecified eyelid: Secondary | ICD-10-CM | POA: Diagnosis not present

## 2022-09-14 DIAGNOSIS — E289 Ovarian dysfunction, unspecified: Secondary | ICD-10-CM | POA: Diagnosis not present

## 2022-09-14 DIAGNOSIS — E559 Vitamin D deficiency, unspecified: Secondary | ICD-10-CM | POA: Diagnosis not present

## 2022-09-14 DIAGNOSIS — E7800 Pure hypercholesterolemia, unspecified: Secondary | ICD-10-CM | POA: Diagnosis not present

## 2022-09-14 DIAGNOSIS — F5109 Other insomnia not due to a substance or known physiological condition: Secondary | ICD-10-CM | POA: Diagnosis not present

## 2022-09-14 DIAGNOSIS — D519 Vitamin B12 deficiency anemia, unspecified: Secondary | ICD-10-CM | POA: Diagnosis not present

## 2022-09-14 DIAGNOSIS — R5383 Other fatigue: Secondary | ICD-10-CM | POA: Diagnosis not present

## 2022-09-14 DIAGNOSIS — R3589 Other polyuria: Secondary | ICD-10-CM | POA: Diagnosis not present

## 2022-09-14 DIAGNOSIS — R635 Abnormal weight gain: Secondary | ICD-10-CM | POA: Diagnosis not present

## 2022-10-23 DIAGNOSIS — H43813 Vitreous degeneration, bilateral: Secondary | ICD-10-CM | POA: Diagnosis not present

## 2022-10-23 DIAGNOSIS — H02403 Unspecified ptosis of bilateral eyelids: Secondary | ICD-10-CM | POA: Diagnosis not present

## 2022-11-09 DIAGNOSIS — H02403 Unspecified ptosis of bilateral eyelids: Secondary | ICD-10-CM | POA: Diagnosis not present

## 2022-12-13 DIAGNOSIS — D492 Neoplasm of unspecified behavior of bone, soft tissue, and skin: Secondary | ICD-10-CM | POA: Diagnosis not present

## 2022-12-13 DIAGNOSIS — L821 Other seborrheic keratosis: Secondary | ICD-10-CM | POA: Diagnosis not present

## 2022-12-13 DIAGNOSIS — D229 Melanocytic nevi, unspecified: Secondary | ICD-10-CM | POA: Diagnosis not present

## 2022-12-13 DIAGNOSIS — G548 Other nerve root and plexus disorders: Secondary | ICD-10-CM | POA: Diagnosis not present

## 2023-01-09 DIAGNOSIS — Z1231 Encounter for screening mammogram for malignant neoplasm of breast: Secondary | ICD-10-CM | POA: Diagnosis not present

## 2023-01-09 DIAGNOSIS — H6992 Unspecified Eustachian tube disorder, left ear: Secondary | ICD-10-CM | POA: Diagnosis not present

## 2023-01-15 ENCOUNTER — Other Ambulatory Visit: Payer: Self-pay | Admitting: Family Medicine

## 2023-01-15 DIAGNOSIS — Z1231 Encounter for screening mammogram for malignant neoplasm of breast: Secondary | ICD-10-CM

## 2023-01-16 DIAGNOSIS — J019 Acute sinusitis, unspecified: Secondary | ICD-10-CM | POA: Diagnosis not present

## 2023-01-19 DIAGNOSIS — H9203 Otalgia, bilateral: Secondary | ICD-10-CM | POA: Diagnosis not present

## 2023-01-19 DIAGNOSIS — M26623 Arthralgia of bilateral temporomandibular joint: Secondary | ICD-10-CM | POA: Diagnosis not present

## 2023-01-19 DIAGNOSIS — H9202 Otalgia, left ear: Secondary | ICD-10-CM | POA: Diagnosis not present

## 2023-01-26 DIAGNOSIS — H02889 Meibomian gland dysfunction of unspecified eye, unspecified eyelid: Secondary | ICD-10-CM | POA: Diagnosis not present

## 2023-02-14 ENCOUNTER — Ambulatory Visit
Admission: RE | Admit: 2023-02-14 | Discharge: 2023-02-14 | Disposition: A | Discharge status: Home / Self Care | Payer: 59 | Source: Ambulatory Visit | Attending: Family Medicine | Admitting: Family Medicine

## 2023-02-14 DIAGNOSIS — Z1231 Encounter for screening mammogram for malignant neoplasm of breast: Secondary | ICD-10-CM | POA: Insufficient documentation

## 2023-02-16 ENCOUNTER — Encounter: Payer: Self-pay | Admitting: Family Medicine

## 2023-02-19 ENCOUNTER — Other Ambulatory Visit: Payer: Self-pay | Admitting: Family Medicine

## 2023-02-19 DIAGNOSIS — R928 Other abnormal and inconclusive findings on diagnostic imaging of breast: Secondary | ICD-10-CM

## 2023-02-26 ENCOUNTER — Ambulatory Visit
Admission: RE | Admit: 2023-02-26 | Discharge: 2023-02-26 | Disposition: A | Discharge status: Home / Self Care | Payer: 59 | Source: Ambulatory Visit | Attending: Family Medicine | Admitting: Family Medicine

## 2023-02-26 DIAGNOSIS — R928 Other abnormal and inconclusive findings on diagnostic imaging of breast: Secondary | ICD-10-CM | POA: Insufficient documentation

## 2023-02-26 DIAGNOSIS — Z7689 Persons encountering health services in other specified circumstances: Secondary | ICD-10-CM | POA: Diagnosis not present

## 2023-02-26 DIAGNOSIS — E039 Hypothyroidism, unspecified: Secondary | ICD-10-CM | POA: Diagnosis not present

## 2023-02-26 DIAGNOSIS — R92333 Mammographic heterogeneous density, bilateral breasts: Secondary | ICD-10-CM | POA: Diagnosis not present

## 2023-03-20 DIAGNOSIS — D519 Vitamin B12 deficiency anemia, unspecified: Secondary | ICD-10-CM | POA: Diagnosis not present

## 2023-03-20 DIAGNOSIS — E7800 Pure hypercholesterolemia, unspecified: Secondary | ICD-10-CM | POA: Diagnosis not present

## 2023-03-20 DIAGNOSIS — R5383 Other fatigue: Secondary | ICD-10-CM | POA: Diagnosis not present

## 2023-03-20 DIAGNOSIS — F5109 Other insomnia not due to a substance or known physiological condition: Secondary | ICD-10-CM | POA: Diagnosis not present

## 2023-03-20 DIAGNOSIS — E559 Vitamin D deficiency, unspecified: Secondary | ICD-10-CM | POA: Diagnosis not present

## 2023-03-20 DIAGNOSIS — E289 Ovarian dysfunction, unspecified: Secondary | ICD-10-CM | POA: Diagnosis not present

## 2023-03-20 DIAGNOSIS — R3589 Other polyuria: Secondary | ICD-10-CM | POA: Diagnosis not present

## 2023-03-20 DIAGNOSIS — R635 Abnormal weight gain: Secondary | ICD-10-CM | POA: Diagnosis not present

## 2023-08-20 DIAGNOSIS — L255 Unspecified contact dermatitis due to plants, except food: Secondary | ICD-10-CM | POA: Diagnosis not present

## 2023-08-20 DIAGNOSIS — J301 Allergic rhinitis due to pollen: Secondary | ICD-10-CM | POA: Diagnosis not present

## 2023-08-20 DIAGNOSIS — H02403 Unspecified ptosis of bilateral eyelids: Secondary | ICD-10-CM | POA: Diagnosis not present

## 2023-08-20 DIAGNOSIS — H0289 Other specified disorders of eyelid: Secondary | ICD-10-CM | POA: Diagnosis not present

## 2023-08-27 DIAGNOSIS — L259 Unspecified contact dermatitis, unspecified cause: Secondary | ICD-10-CM | POA: Diagnosis not present

## 2023-08-27 DIAGNOSIS — T7840XA Allergy, unspecified, initial encounter: Secondary | ICD-10-CM | POA: Diagnosis not present

## 2023-10-04 DIAGNOSIS — R1032 Left lower quadrant pain: Secondary | ICD-10-CM | POA: Diagnosis not present

## 2023-10-04 DIAGNOSIS — N950 Postmenopausal bleeding: Secondary | ICD-10-CM | POA: Diagnosis not present

## 2023-10-09 DIAGNOSIS — H02834 Dermatochalasis of left upper eyelid: Secondary | ICD-10-CM | POA: Diagnosis not present

## 2023-10-09 DIAGNOSIS — H02831 Dermatochalasis of right upper eyelid: Secondary | ICD-10-CM | POA: Diagnosis not present

## 2023-10-09 DIAGNOSIS — H02403 Unspecified ptosis of bilateral eyelids: Secondary | ICD-10-CM | POA: Diagnosis not present

## 2023-10-15 DIAGNOSIS — R1032 Left lower quadrant pain: Secondary | ICD-10-CM | POA: Diagnosis not present

## 2023-10-15 DIAGNOSIS — N950 Postmenopausal bleeding: Secondary | ICD-10-CM | POA: Diagnosis not present

## 2023-10-16 ENCOUNTER — Ambulatory Visit: Admission: EM | Admit: 2023-10-16 | Discharge: 2023-10-16 | Discharge status: Against Medical Advice

## 2023-10-16 DIAGNOSIS — S61012A Laceration without foreign body of left thumb without damage to nail, initial encounter: Secondary | ICD-10-CM | POA: Diagnosis not present

## 2023-10-18 DIAGNOSIS — Z124 Encounter for screening for malignant neoplasm of cervix: Secondary | ICD-10-CM | POA: Diagnosis not present

## 2023-10-18 DIAGNOSIS — N950 Postmenopausal bleeding: Secondary | ICD-10-CM | POA: Diagnosis not present

## 2023-10-23 DIAGNOSIS — S61012A Laceration without foreign body of left thumb without damage to nail, initial encounter: Secondary | ICD-10-CM | POA: Diagnosis not present

## 2023-10-23 DIAGNOSIS — S6432XA Injury of digital nerve of left thumb, initial encounter: Secondary | ICD-10-CM | POA: Diagnosis not present

## 2023-10-26 DIAGNOSIS — S6432XA Injury of digital nerve of left thumb, initial encounter: Secondary | ICD-10-CM | POA: Diagnosis not present

## 2023-10-26 DIAGNOSIS — G8918 Other acute postprocedural pain: Secondary | ICD-10-CM | POA: Diagnosis not present

## 2023-10-30 DIAGNOSIS — S6432XD Injury of digital nerve of left thumb, subsequent encounter: Secondary | ICD-10-CM | POA: Diagnosis not present
